# Patient Record
Sex: Male | Born: 1945
Health system: Southern US, Community
[De-identification: ages and names within clinical notes are randomized; demographics above are authoritative.]

## PROBLEM LIST (undated history)

## (undated) DIAGNOSIS — Z9889 Other specified postprocedural states: Secondary | ICD-10-CM

## (undated) DIAGNOSIS — I447 Left bundle-branch block, unspecified: Principal | ICD-10-CM

## (undated) DIAGNOSIS — C61 Malignant neoplasm of prostate: Secondary | ICD-10-CM

## (undated) DIAGNOSIS — R112 Nausea with vomiting, unspecified: Secondary | ICD-10-CM

## (undated) DIAGNOSIS — M1990 Unspecified osteoarthritis, unspecified site: Secondary | ICD-10-CM

## (undated) DIAGNOSIS — R7301 Impaired fasting glucose: Secondary | ICD-10-CM

## (undated) DIAGNOSIS — R7303 Prediabetes: Secondary | ICD-10-CM

## (undated) DIAGNOSIS — I10 Essential (primary) hypertension: Secondary | ICD-10-CM

## (undated) DIAGNOSIS — K4090 Unilateral inguinal hernia, without obstruction or gangrene, not specified as recurrent: Secondary | ICD-10-CM

## (undated) DIAGNOSIS — E781 Pure hyperglyceridemia: Secondary | ICD-10-CM

## (undated) DIAGNOSIS — F419 Anxiety disorder, unspecified: Secondary | ICD-10-CM

## (undated) HISTORY — DX: Impaired fasting glucose: R73.01

## (undated) HISTORY — DX: Left bundle-branch block, unspecified: I44.7

## (undated) HISTORY — DX: Essential (primary) hypertension: I10

## (undated) HISTORY — DX: Pure hyperglyceridemia: E78.1

## (undated) HISTORY — DX: Anxiety disorder, unspecified: F41.9

## (undated) HISTORY — PX: COLONOSCOPY: SHX174

---
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
## 1993-09-19 HISTORY — PX: HERNIA REPAIR: SHX51

## 2003-10-25 ENCOUNTER — Encounter: Payer: Self-pay | Admitting: Cardiology

## 2009-12-14 ENCOUNTER — Encounter: Payer: Self-pay | Admitting: Cardiology

## 2009-12-21 ENCOUNTER — Encounter: Payer: Self-pay | Admitting: Cardiology

## 2010-06-22 ENCOUNTER — Encounter: Payer: Self-pay | Admitting: Cardiology

## 2011-12-05 ENCOUNTER — Telehealth: Payer: Self-pay | Admitting: Cardiology

## 2011-12-05 NOTE — Telephone Encounter (Signed)
Per pt spouse he has had some issues with his b/p and was a pt of Dr. Swaziland but doesn't know when the last visit was and wants to be seen

## 2011-12-05 NOTE — Telephone Encounter (Signed)
Unable to find paper chart on patient or information.   Spoke with wife and he currently sees Dr Felipa Eth for blood pressure issues.  Is scheduled to see Dr Felipa Eth for CPE in April.  Feels he needs to be seen since blood pressure under poor control.  ill forward back to Lela to call patient and get an appointment and to [facility], LPN with Dr Thomasene Lot  Call back on cell # [phone]

## 2011-12-06 NOTE — Telephone Encounter (Signed)
Patient's wife called, appointment scheduled with Dr.Jordan 01/10/12.Stated patient has appointment with Dr.Avva 01/09/12,stated she will bring copy of B/P readings and copy of last lab.

## 2011-12-20 ENCOUNTER — Encounter: Payer: Self-pay | Admitting: Cardiology

## 2012-01-04 ENCOUNTER — Encounter: Payer: Self-pay | Admitting: Cardiology

## 2012-01-10 ENCOUNTER — Ambulatory Visit: Payer: Self-pay | Admitting: Cardiology

## 2012-01-10 ENCOUNTER — Ambulatory Visit (INDEPENDENT_AMBULATORY_CARE_PROVIDER_SITE_OTHER): Payer: Self-pay | Admitting: Cardiology

## 2012-01-10 ENCOUNTER — Encounter: Payer: Self-pay | Admitting: Cardiology

## 2012-01-10 VITALS — BP 164/88 | HR 90 | Ht 73.0 in | Wt 201.0 lb

## 2012-01-10 DIAGNOSIS — R7309 Other abnormal glucose: Secondary | ICD-10-CM

## 2012-01-10 DIAGNOSIS — I10 Essential (primary) hypertension: Secondary | ICD-10-CM

## 2012-01-10 DIAGNOSIS — R7303 Prediabetes: Secondary | ICD-10-CM

## 2012-01-10 DIAGNOSIS — E785 Hyperlipidemia, unspecified: Secondary | ICD-10-CM | POA: Insufficient documentation

## 2012-01-10 MED ORDER — CARVEDILOL 12.5 MG PO TABS: 12.5000 mg | ORAL_TABLET | Freq: Two times a day (BID) | ORAL | Status: DC

## 2012-01-10 NOTE — Patient Instructions (Signed)
Continue with your diet and exercise.  We will add carvedilol 12.5 mg twice a day to your current medication.  I will see you again in 6 weeks.

## 2012-01-10 NOTE — Progress Notes (Signed)
Wilmon Arms Date of Birth: 03-06-1946 Medical Record #[id number]  History of Present Illness: Mr. Demetriou is self-referred today for evaluation of hypertension. He is a pleasant 66 year old white male who I know well from the past. He has a history of hypertension and prediabetes. He also has a history of hyperlipidemia. He notes that his blood pressure readings have been quite labile and have been poorly controlled. This was attributed to anxiety in the past. However, even when he checks his blood pressure readings at home he gets readings that are elevated. Bringing a diary of his readings today his blood pressure ranges anywhere from 142- 180 systolic with diastolics between 86 and 100. He has been on lisinopril 40 mg chronically. He tolerates this well. He walks 30 minutes a day on his treadmill and eats a heart healthy diet. He has controlled his weight.  Current Outpatient Prescriptions on File Prior to Visit  Medication Sig Dispense Refill  . Calcium Carbonate-Vitamin D (CALCIUM + D PO) Take 1,200 mg by mouth daily.      Marland Kitchen lisinopril (PRINIVIL,ZESTRIL) 40 MG tablet Take 40 mg by mouth daily.      . simvastatin (ZOCOR) 40 MG tablet Take 40 mg by mouth every evening.      . carvedilol (COREG) 12.5 MG tablet Take 1 tablet (12.5 mg total) by mouth 2 (two) times daily with a meal.  60 tablet  11    Allergies  Allergen Reactions  . Codeine     Past Medical History  Diagnosis Date  . Hypertriglyceridemia     Mild  . Hemorrhoids   . White coat hypertension   . Impaired fasting glucose   . Allergic rhinitis   . Anxiety     Past Surgical History  Procedure Date  . Tonsillectomy 1947  . Hernia repair 1995  . Colonoscopy     History  Smoking status  . Former Smoker  Smokeless tobacco  . Not on file    History  Alcohol Use No    Family History  Problem Relation Age of Onset  . Heart attack Mother   . Skin cancer Mother   . Diabetes Mother   . Hypertension Mother      Review of Systems: The review of systems is positive for anxiety.  All other systems were reviewed and are negative.  Physical Exam: BP 164/88  Pulse 90  Ht 6\' 1"  (1.854 m)  Wt 91.173 kg (201 lb)  BMI 26.52 kg/m2 The patient is alert and oriented x 3.  The mood and affect are normal.  The skin is warm and dry.  Color is normal.  The HEENT exam reveals that the sclera are nonicteric.  The mucous membranes are moist.  The carotids are 2+ without bruits.  There is no thyromegaly.  There is no JVD.  The lungs are clear.  The chest wall is non tender.  The heart exam reveals a regular rate with a normal S1 and S2.  There are no murmurs, gallops, or rubs.  The PMI is not displaced.   Abdominal exam reveals good bowel sounds.  There is no guarding or rebound.  There is no hepatosplenomegaly or tenderness.  There are no masses.  Exam of the legs reveal no clubbing, cyanosis, or edema.  The legs are without rashes.  The distal pulses are intact.  Cranial nerves II - XII are intact.  Motor and sensory functions are intact.  The gait is normal.  LABORATORY DATA: ECG  demonstrates normal sinus rhythm. He has LVH with repolarization abnormality. Laboratory data was reviewed from one year ago which demonstrated good control of his lipids. He had normal renal function and liver function.  Assessment / Plan:

## 2012-01-10 NOTE — Assessment & Plan Note (Signed)
He appears to be doing right things from a lifestyle modification standpoint. I think he needs additional blood pressure lowering medication and started him on carvedilol 12.5 mg twice a day. We'll continue with his lisinopril. I reinforced the need for sodium restriction. He will keep a diary of his blood pressure and return in 6 weeks for followup. If we have difficulty controlling his blood pressure on multiple agents we may need to consider evaluation for secondary causes of hypertension.

## 2012-02-21 ENCOUNTER — Encounter: Payer: Self-pay | Admitting: Cardiology

## 2012-02-21 ENCOUNTER — Ambulatory Visit (INDEPENDENT_AMBULATORY_CARE_PROVIDER_SITE_OTHER): Payer: Medicare Other | Admitting: Cardiology

## 2012-02-21 VITALS — BP 172/88 | HR 88 | Ht 73.0 in | Wt 205.8 lb

## 2012-02-21 DIAGNOSIS — I10 Essential (primary) hypertension: Secondary | ICD-10-CM

## 2012-02-21 MED ORDER — CARVEDILOL 25 MG PO TABS: 25.0000 mg | ORAL_TABLET | Freq: Two times a day (BID) | ORAL | Status: DC

## 2012-02-21 NOTE — Patient Instructions (Signed)
We will increase your carvedilol to 25 mg twice a day.  We will schedule you for a renal doppler.  I will see you back in 1 months. Keep your blood pressure diary.

## 2012-02-22 NOTE — Progress Notes (Signed)
Evan Mitchell Date of Birth: Sep 28, 1945 Medical Record #[id number]  History of Present Illness: Evan Mitchell is seen today for followup of his hypertension. On his last visit we had added carvedilol 12.5 mg twice a day. He is tolerating this well. His blood pressure still remained high with typical readings of 155/85. He did bring his cuff in today and we correlated. His cuff tends to read higher on the diastolic but was very close on systolic readings. In general he continues to feel well. He does report that since he has been on carvedilol he has had no further anxiety spells.  Current Outpatient Prescriptions on File Prior to Visit  Medication Sig Dispense Refill  . aspirin EC 81 MG tablet Take 81 mg by mouth daily.      . Calcium Carbonate-Vitamin D (CALCIUM + D PO) Take 1,200 mg by mouth daily.      . fish oil-omega-3 fatty acids 1000 MG capsule Take 2 g by mouth 3 (three) times daily.      . Flaxseed, Linseed, (FLAX SEED OIL) 1000 MG CAPS Take 1,000 mg by mouth 3 (three) times daily.      Marland Kitchen GLUCOSAMINE-CHONDROITIN PO Take by mouth 3 (three) times daily.      Marland Kitchen lisinopril (PRINIVIL,ZESTRIL) 40 MG tablet Take 40 mg by mouth daily.      . Multiple Vitamins-Minerals (PRESERVISION AREDS PO) Take by mouth 2 (two) times daily.      . simvastatin (ZOCOR) 40 MG tablet Take 40 mg by mouth every evening.      . vitamin C (ASCORBIC ACID) 500 MG tablet Take 500 mg by mouth daily.        Allergies  Allergen Reactions  . Codeine     Past Medical History  Diagnosis Date  . Hypertriglyceridemia     Mild  . Hemorrhoids   . White coat hypertension   . Impaired fasting glucose   . Allergic rhinitis   . Anxiety     Past Surgical History  Procedure Date  . Tonsillectomy 1947  . Hernia repair 1995  . Colonoscopy     History  Smoking status  . Former Smoker  Smokeless tobacco  . Not on file    History  Alcohol Use No    Family History  Problem Relation Age of Onset  . Heart attack  Mother   . Skin cancer Mother   . Diabetes Mother   . Hypertension Mother     Review of Systems: As noted in history of present illness.  All other systems were reviewed and are negative.  Physical Exam: BP 172/88  Pulse 88  Ht 6\' 1"  (1.854 m)  Wt 205 lb 12.8 oz (93.35 kg)  BMI 27.15 kg/m2 The patient is alert and oriented x 3.  The mood and affect are normal.  The skin is warm and dry.  Color is normal.  The HEENT exam reveals that the sclera are nonicteric.  The mucous membranes are moist.  The carotids are 2+ without bruits.  There is no thyromegaly.  There is no JVD.  The lungs are clear.  The chest wall is non tender.  The heart exam reveals a regular rate with a normal S1 and S2.  There are no murmurs, gallops, or rubs.  The PMI is not displaced.   Abdominal exam reveals good bowel sounds.  There is no guarding or rebound.  There is no hepatosplenomegaly or tenderness.  There are no masses.  Exam of the  legs reveal no clubbing, cyanosis, or edema.  The legs are without rashes.  The distal pulses are intact.  Cranial nerves II - XII are intact.  Motor and sensory functions are intact.  The gait is normal.  LABORATORY DATA:   Assessment / Plan:

## 2012-02-22 NOTE — Assessment & Plan Note (Signed)
Blood pressure still not adequately controlled on combination of carvedilol and lisinopril. We will increase his carvedilol 25 mg twice a day. We'll schedule him for renal duplex. Continue lifestyle modifications. We'll followup again in 4-6 weeks.

## 2012-03-14 ENCOUNTER — Encounter (INDEPENDENT_AMBULATORY_CARE_PROVIDER_SITE_OTHER): Payer: Medicare Other

## 2012-03-14 DIAGNOSIS — I10 Essential (primary) hypertension: Secondary | ICD-10-CM

## 2012-03-28 ENCOUNTER — Encounter: Payer: Self-pay | Admitting: Cardiology

## 2012-03-28 ENCOUNTER — Ambulatory Visit (INDEPENDENT_AMBULATORY_CARE_PROVIDER_SITE_OTHER): Payer: Medicare Other | Admitting: Cardiology

## 2012-03-28 VITALS — BP 158/90 | HR 73 | Ht 73.0 in | Wt 207.0 lb

## 2012-03-28 DIAGNOSIS — I10 Essential (primary) hypertension: Secondary | ICD-10-CM

## 2012-03-28 MED ORDER — HYDROCHLOROTHIAZIDE 25 MG PO TABS: 25.0000 mg | ORAL_TABLET | Freq: Every day | ORAL | Status: DC

## 2012-03-28 NOTE — Patient Instructions (Signed)
We will add HCTZ to your current medication  I will see you in 2 months and check your potassium then

## 2012-03-28 NOTE — Assessment & Plan Note (Signed)
Blood pressure still not optimal despite lisinopril 40 mg daily and carvedilol 25 mg twice a day. We will add HCTZ 25 mg daily. I will have him return in 2 months and we will check a basic metabolic panel at that time. He is to call if he has any significant side effects.

## 2012-03-28 NOTE — Progress Notes (Signed)
Evan Mitchell Date of Birth: 07/29/1946 Medical Record #[id number]  History of Present Illness: Evan Mitchell is seen today for followup of his hypertension. On his last visit we increased his carvedilol to 25 mg twice a day. He is tolerating this well. His home blood pressure readings have ranged anywhere from 138 2 160 systolic. Diastolic readings are typically in the mid 80s. He still reports significant anxiety when his blood pressure is taken. He has no other new complaints.  Current Outpatient Prescriptions on File Prior to Visit  Medication Sig Dispense Refill  . aspirin EC 81 MG tablet Take 81 mg by mouth daily.      . Calcium Carbonate-Vitamin D (CALCIUM + D PO) Take 1,200 mg by mouth daily.      . carvedilol (COREG) 25 MG tablet Take 1 tablet (25 mg total) by mouth 2 (two) times daily.  180 tablet  3  . fish oil-omega-3 fatty acids 1000 MG capsule Take 2 g by mouth 3 (three) times daily.      . Flaxseed, Linseed, (FLAX SEED OIL) 1000 MG CAPS Take 1,000 mg by mouth 3 (three) times daily.      Marland Kitchen GLUCOSAMINE-CHONDROITIN PO Take by mouth 3 (three) times daily.      Marland Kitchen lisinopril (PRINIVIL,ZESTRIL) 40 MG tablet Take 40 mg by mouth daily.      . Multiple Vitamins-Minerals (PRESERVISION AREDS PO) Take by mouth 2 (two) times daily.      . simvastatin (ZOCOR) 40 MG tablet Take 40 mg by mouth every evening.      . vitamin C (ASCORBIC ACID) 500 MG tablet Take 500 mg by mouth daily.      . hydrochlorothiazide (HYDRODIURIL) 25 MG tablet Take 1 tablet (25 mg total) by mouth daily.  90 tablet  3    Allergies  Allergen Reactions  . Codeine     Past Medical History  Diagnosis Date  . Hypertriglyceridemia     Mild  . Hemorrhoids   . White coat hypertension   . Impaired fasting glucose   . Allergic rhinitis   . Anxiety     Past Surgical History  Procedure Date  . Tonsillectomy 1947  . Hernia repair 1995  . Colonoscopy     History  Smoking status  . Former Smoker  Smokeless tobacco    . Not on file    History  Alcohol Use No    Family History  Problem Relation Age of Onset  . Heart attack Mother   . Skin cancer Mother   . Diabetes Mother   . Hypertension Mother     Review of Systems: As noted in history of present illness.  All other systems were reviewed and are negative.  Physical Exam: BP 158/90  Pulse 73  Ht 6\' 1"  (1.854 m)  Wt 207 lb (93.895 kg)  BMI 27.31 kg/m2  SpO2 98% The patient is alert and oriented x 3.  The mood and affect are normal.  The skin is warm and dry.  Color is normal.  The HEENT is unremarkable. The carotids are 2+ without bruits.  There is no thyromegaly.  There is no JVD.  The lungs are clear.  The chest wall is non tender.  The heart exam reveals a regular rate with a normal S1 and S2.  There are no murmurs, gallops, or rubs.  The PMI is not displaced.   Abdominal exam reveals good bowel sounds.  There is no guarding or rebound.  There is  no hepatosplenomegaly or tenderness.  There are no masses.  Exam of the legs reveal no clubbing, cyanosis, or edema.  The legs are without rashes.  The distal pulses are intact.  Cranial nerves II - XII are intact.  Motor and sensory functions are intact.  The gait is normal.  LABORATORY DATA:   Assessment / Plan:

## 2012-05-30 ENCOUNTER — Encounter: Payer: Self-pay | Admitting: Cardiology

## 2012-05-30 ENCOUNTER — Other Ambulatory Visit (INDEPENDENT_AMBULATORY_CARE_PROVIDER_SITE_OTHER): Payer: Medicare Other

## 2012-05-30 ENCOUNTER — Ambulatory Visit (INDEPENDENT_AMBULATORY_CARE_PROVIDER_SITE_OTHER): Payer: Medicare Other | Admitting: Cardiology

## 2012-05-30 VITALS — BP 136/78 | HR 74 | Ht 73.0 in | Wt 208.4 lb

## 2012-05-30 DIAGNOSIS — I10 Essential (primary) hypertension: Secondary | ICD-10-CM

## 2012-05-30 DIAGNOSIS — E785 Hyperlipidemia, unspecified: Secondary | ICD-10-CM

## 2012-05-30 NOTE — Patient Instructions (Signed)
Continue your current therapy  We will call with the results of your lab work today.  I will see you again in 6 months.

## 2012-05-30 NOTE — Progress Notes (Signed)
Evan Mitchell Date of Birth: [birth year] Medical Record #[id number]  History of Present Illness: Evan Mitchell is seen today for followup of Evan Mitchell hypertension. On Evan Mitchell last visit we added HCTZ to Evan Mitchell medications. Since then Evan Mitchell blood pressure has been excellent. Evan Mitchell home blood pressure readings have ranged from 120-140 systolic with diastolic readings from 72-90. He feels very well. He has had less anxiety. He denies any shortness of breath. Evan Mitchell energy level is good.  Current Outpatient Prescriptions on File Prior to Visit  Medication Sig Dispense Refill  . aspirin EC 81 MG tablet Take 81 mg by mouth daily.      . Calcium Carbonate-Vitamin D (CALCIUM + D PO) Take 1,200 mg by mouth daily.      . carvedilol (COREG) 25 MG tablet Take 1 tablet (25 mg total) by mouth 2 (two) times daily.  180 tablet  3  . fish oil-omega-3 fatty acids 1000 MG capsule Take 2 g by mouth 3 (three) times daily.      . Flaxseed, Linseed, (FLAX SEED OIL) 1000 MG CAPS Take 1,000 mg by mouth 3 (three) times daily.      Marland Kitchen GLUCOSAMINE-CHONDROITIN PO Take by mouth 3 (three) times daily.      . hydrochlorothiazide (HYDRODIURIL) 25 MG tablet Take 1 tablet (25 mg total) by mouth daily.  90 tablet  3  . lisinopril (PRINIVIL,ZESTRIL) 40 MG tablet Take 40 mg by mouth daily.      . Multiple Vitamins-Minerals (PRESERVISION AREDS PO) Take by mouth 2 (two) times daily.      . simvastatin (ZOCOR) 40 MG tablet Take 40 mg by mouth every evening.      . vitamin C (ASCORBIC ACID) 500 MG tablet Take 500 mg by mouth daily.        Allergies  Allergen Reactions  . Codeine     Past Medical History  Diagnosis Date  . Hypertriglyceridemia     Mild  . Hemorrhoids   . White coat hypertension   . Impaired fasting glucose   . Allergic rhinitis   . Anxiety     Past Surgical History  Procedure Date  . Tonsillectomy 1947  . Hernia repair 1995  . Colonoscopy     History  Smoking status  . Former Smoker  Smokeless tobacco  . Not on file     History  Alcohol Use No    Family History  Problem Relation Age of Onset  . Heart attack Mother   . Skin cancer Mother   . Diabetes Mother   . Hypertension Mother     Review of Systems: As noted in history of present illness.  All other systems were reviewed and are negative.  Physical Exam: BP 136/78  Pulse 74  Ht 6\' 1"  (1.854 m)  Wt 94.53 kg (208 lb 6.4 oz)  BMI 27.50 kg/m2  SpO2 95% The patient is alert and oriented x 3.  The mood and affect are normal.  The skin is warm and dry.  Color is normal.  The HEENT is unremarkable. The carotids are 2+ without bruits.  There is no thyromegaly.  There is no JVD.  The lungs are clear.  The chest wall is non tender.  The heart exam reveals a regular rate with a normal S1 and S2.  There are no murmurs, gallops, or rubs.  The PMI is not displaced.   Abdominal exam reveals good bowel sounds.  There is no guarding or rebound.  There is no hepatosplenomegaly or  tenderness.  There are no masses.  Exam of the legs reveal no clubbing, cyanosis, or edema.  The legs are without rashes.  The distal pulses are intact.  Cranial nerves II - XII are intact.  Motor and sensory functions are intact.  The gait is normal.  LABORATORY DATA:   Assessment / Plan: 1. Hypertension. Blood pressure is now well controlled on combination of carvedilol, lisinopril, and HCTZ. We will check a basic metabolic panel today. We'll continue on Evan Mitchell current therapy and followup again in 6 months.

## 2012-05-31 LAB — BASIC METABOLIC PANEL
Chloride: 103 mEq/L (ref 96–112)
Creatinine, Ser: 1 mg/dL (ref 0.4–1.5)
GFR: 80.77 mL/min (ref >60.00)

## 2012-12-10 ENCOUNTER — Telehealth: Payer: Self-pay | Admitting: Cardiology

## 2012-12-10 NOTE — Telephone Encounter (Signed)
New Prob   Pt currently has an upper resp infection and an infection in both eyes. Would like to know if Dr. Swaziland still wants him to come in on Wednesday or reschedule.

## 2012-12-10 NOTE — Telephone Encounter (Signed)
Pt's wife called because she said pt started having a respiratory infection and infection in both eyes Pt was seen by his PCP last Wednesday and diagnosed him having a Upper respiratory  virus infection also the same in his eyes . The PCP recommended antibiotics drops  for his eye. Pt has not have a temperature for the last 3 days. Pt's eye look a lot better. Pt returned to work today. Wife wants to know if it is okay with Dr. Swaziland to come for  his office visit on Wednesday 3./26/14.

## 2012-12-11 NOTE — Telephone Encounter (Signed)
Spoke with patient's wife she stated husband has not had a fever in 4 days and is feeling much better.States he will be keeping appointment 12/12/12 with Dr.Jordan.

## 2012-12-12 ENCOUNTER — Encounter: Payer: Self-pay | Admitting: Cardiology

## 2012-12-12 ENCOUNTER — Ambulatory Visit (INDEPENDENT_AMBULATORY_CARE_PROVIDER_SITE_OTHER): Payer: Medicare Other | Admitting: Cardiology

## 2012-12-12 VITALS — BP 148/70 | HR 80 | Ht 73.0 in | Wt 208.8 lb

## 2012-12-12 DIAGNOSIS — E785 Hyperlipidemia, unspecified: Secondary | ICD-10-CM

## 2012-12-12 DIAGNOSIS — I447 Left bundle-branch block, unspecified: Secondary | ICD-10-CM | POA: Insufficient documentation

## 2012-12-12 DIAGNOSIS — I10 Essential (primary) hypertension: Secondary | ICD-10-CM

## 2012-12-12 HISTORY — DX: Left bundle-branch block, unspecified: I44.7

## 2012-12-12 NOTE — Patient Instructions (Signed)
We will schedule you for a nuclear stress test and an echocardiogram  Continue your current medication

## 2012-12-12 NOTE — Progress Notes (Signed)
Evan Mitchell Date of Birth: 08-21-1946 Medical Record #[id number]  History of Present Illness: Evan Mitchell is seen today for followup of his hypertension. He reports he is doing very well. He brings extensive blood pressure readings which show excellent control with only one reading above 150 systolic. He is feeling well. He denies any symptoms of dyspnea or chest pain. He's had no edema. His anxiety is still well controlled.  Current Outpatient Prescriptions on File Prior to Visit  Medication Sig Dispense Refill  . aspirin EC 81 MG tablet Take 81 mg by mouth daily.      . Calcium Carbonate-Vitamin D (CALCIUM + D PO) Take 1,200 mg by mouth daily.      . carvedilol (COREG) 25 MG tablet Take 1 tablet (25 mg total) by mouth 2 (two) times daily.  180 tablet  3  . fish oil-omega-3 fatty acids 1000 MG capsule Take 2 g by mouth 3 (three) times daily.      . Flaxseed, Linseed, (FLAX SEED OIL) 1000 MG CAPS Take 1,000 mg by mouth 3 (three) times daily.      Marland Kitchen GLUCOSAMINE-CHONDROITIN PO Take by mouth 3 (three) times daily.      . hydrochlorothiazide (HYDRODIURIL) 25 MG tablet Take 1 tablet (25 mg total) by mouth daily.  90 tablet  3  . lisinopril (PRINIVIL,ZESTRIL) 40 MG tablet Take 40 mg by mouth daily.      . Multiple Vitamins-Minerals (PRESERVISION AREDS PO) Take by mouth 2 (two) times daily.      . simvastatin (ZOCOR) 40 MG tablet Take 40 mg by mouth every evening.      . vitamin C (ASCORBIC ACID) 500 MG tablet Take 500 mg by mouth daily.       No current facility-administered medications on file prior to visit.    Allergies  Allergen Reactions  . Codeine     Past Medical History  Diagnosis Date  . Hypertriglyceridemia     Mild  . Hemorrhoids   . HTN (hypertension)   . Impaired fasting glucose   . Allergic rhinitis   . Anxiety   . LBBB (left bundle branch block) 12/12/2012    Past Surgical History  Procedure Laterality Date  . Tonsillectomy  1947  . Hernia repair  1995  .  Colonoscopy      History  Smoking status  . Former Smoker  Smokeless tobacco  . Not on file    History  Alcohol Use No    Family History  Problem Relation Age of Onset  . Heart attack Mother   . Skin cancer Mother   . Diabetes Mother   . Hypertension Mother     Review of Systems: As noted in history of present illness.  All other systems were reviewed and are negative.  Physical Exam: BP 148/70  Pulse 80  Ht 6\' 1"  (1.854 m)  Wt 208 lb 12.8 oz (94.711 kg)  BMI 27.55 kg/m2 The patient is alert and oriented x 3.   The skin is warm and dry.  Color is normal.  The HEENT is unremarkable. The carotids are 2+ without bruits.  There is no thyromegaly.  There is no JVD.  The lungs are clear.  The heart exam reveals a regular rate with a normal S1 and S2.  There are no murmurs, gallops, or rubs.  The PMI is not displaced.   Abdominal exam reveals good bowel sounds.   There are no masses.  Exam of the legs reveal no  clubbing, cyanosis, or edema.  The distal pulses are intact.  Cranial nerves II - XII are intact.  Motor and sensory functions are intact.  The gait is normal.  LABORATORY DATA: ECG today demonstrates normal sinus rhythm with a left bundle branch block. This is new compared to April 2013.  Assessment / Plan: 1. Hypertension. Blood pressure is now well controlled on combination of carvedilol, lisinopril, and HCTZ.  We'll continue on his current therapy and followup again in 6 months.  2. Left bundle branch block. This is a new finding. Given his risk factors I recommended a LexiScan Myoview study to rule out ischemia and an echocardiogram to assess his LV function. If these are unremarkable we can reassure him that his left bundle branch block is benign. I suspect it is related to his chronic hypertension.

## 2012-12-14 ENCOUNTER — Ambulatory Visit (HOSPITAL_COMMUNITY): Payer: Medicare Other | Attending: Cardiology | Admitting: Radiology

## 2012-12-14 DIAGNOSIS — I447 Left bundle-branch block, unspecified: Secondary | ICD-10-CM

## 2012-12-14 DIAGNOSIS — Z87891 Personal history of nicotine dependence: Secondary | ICD-10-CM | POA: Insufficient documentation

## 2012-12-14 DIAGNOSIS — Z8249 Family history of ischemic heart disease and other diseases of the circulatory system: Secondary | ICD-10-CM | POA: Insufficient documentation

## 2012-12-14 DIAGNOSIS — E785 Hyperlipidemia, unspecified: Secondary | ICD-10-CM

## 2012-12-14 DIAGNOSIS — I10 Essential (primary) hypertension: Secondary | ICD-10-CM

## 2012-12-14 NOTE — Progress Notes (Signed)
Echocardiogram performed.  

## 2012-12-18 ENCOUNTER — Ambulatory Visit (HOSPITAL_COMMUNITY): Payer: Medicare Other | Attending: Cardiology | Admitting: Radiology

## 2012-12-18 VITALS — BP 152/103 | HR 75 | Ht 73.0 in | Wt 203.0 lb

## 2012-12-18 DIAGNOSIS — E785 Hyperlipidemia, unspecified: Secondary | ICD-10-CM

## 2012-12-18 DIAGNOSIS — R9431 Abnormal electrocardiogram [ECG] [EKG]: Secondary | ICD-10-CM

## 2012-12-18 DIAGNOSIS — Z87891 Personal history of nicotine dependence: Secondary | ICD-10-CM | POA: Insufficient documentation

## 2012-12-18 DIAGNOSIS — I10 Essential (primary) hypertension: Secondary | ICD-10-CM

## 2012-12-18 DIAGNOSIS — I447 Left bundle-branch block, unspecified: Secondary | ICD-10-CM | POA: Insufficient documentation

## 2012-12-18 DIAGNOSIS — Z8249 Family history of ischemic heart disease and other diseases of the circulatory system: Secondary | ICD-10-CM | POA: Insufficient documentation

## 2012-12-18 MED ORDER — ADENOSINE (DIAGNOSTIC) 3 MG/ML IV SOLN
0.5600 mg/kg | Freq: Once | INTRAVENOUS | Status: AC
  Administered 2012-12-18: 51.6 mg via INTRAVENOUS

## 2012-12-18 MED ORDER — TECHNETIUM TC 99M SESTAMIBI GENERIC - CARDIOLITE
10.0000 | Freq: Once | INTRAVENOUS | Status: AC | PRN
  Administered 2012-12-18: 10 via INTRAVENOUS

## 2012-12-18 MED ORDER — TECHNETIUM TC 99M SESTAMIBI GENERIC - CARDIOLITE
30.0000 | Freq: Once | INTRAVENOUS | Status: AC | PRN
  Administered 2012-12-18: 30 via INTRAVENOUS

## 2012-12-18 NOTE — Progress Notes (Signed)
[facility] SITE 3 NUCLEAR MED [address] [place], [place] [address] [phone]    Cardiology Nuclear Med Study  Evan Mitchell is a 67 y.o. male     MRN : [id number]     DOB: 1946-05-31  Procedure Date: 12/18/2012  Nuclear Med Background Indication for Stress Test:  Evaluation for Ischemia and Abnormal EKG (New LBBB) History:  ~18 yrs ago GXT:ok per patient Cardiac Risk Factors: Family History - CAD, History of Smoking, Hypertension, LBBB and Lipids  Symptoms:  No cardiac complaints.   Nuclear Pre-Procedure Caffeine/Decaff Intake:  5:00pm NPO After: 9:00pm   Lungs:  Clear. O2 Sat: 96% on room air. IV 0.9% NS with Angio Cath:  22g  IV Site: R Forearm, tolerated well IV Started by:  Irean Hong, RN  Chest Size (in):  46 Cup Size: n/a  Height: 6\' 1"  (1.854 m)  Weight:  203 lb (92.08 kg)  BMI:  Body mass index is 26.79 kg/(m^2). Tech Comments:  Last dose Coreg @ 6:00 pm yesterday; took Lisinopril, HCTZ this am    Nuclear Med Study 1 or 2 day study: 1 day  Stress Test Type:  Adenosine  Reading MD: Willa Rough, MD  Order Authorizing Provider:  Peter Swazil[place], MD  Resting Radionuclide: Technetium 37m Sestamibi  Resting Radionuclide Dose: 11.0 mCi   Stress Radionuclide:  Technetium 4m Sestamibi  Stress Radionuclide Dose: 33.0 mCi           Stress Protocol Rest HR: 75 Stress HR: 94  Rest BP: 152/103 Stress BP: 164/83  Exercise Time (min): n/a METS: n/a   Predicted Max HR: 152 bpm % Max HR: 61.84 bpm Rate Pressure Product: 72536   Dose of Adenosine (mg):  51.7 Dose of Lexiscan: n/a mg  Dose of Atropine (mg): n/a Dose of Dobutamine: n/a mcg/kg/min (at max HR)  Stress Test Technologist: Smiley Houseman, CMA-N  Nuclear Technologist:  Domenic Polite, CNMT     Rest Procedure:  Myocardial perfusion imaging was performed at rest 45 minutes following the intravenous administration of Technetium 43m Sestamibi.  Rest ECG: Normal sinus rhythm with left bundle  branch block  Stress Procedure:  The patient received IV adenosine at 140 mcg/kg/min for 4 minutes.  He c/o chest pressure with infusion, relieved immediately post infusion.  Technetium 26m Sestamibi was injected at the 2 minute mark and quantitative spect images were obtained after a 45 minute delay.  Stress ECG: No significant change  QPS Raw Data Images:  Patient motion noted; appropriate software correction applied. Stress Images:  Normal uptake in all segments Rest Images:  Normal homogeneous uptake in all areas of the myocardium. Subtraction (SDS):  No evidence of ischemia. Transient Ischemic Dilatation (Normal <1.22):  1.05 Lung/Heart Ratio (Normal <0.45):  0.31  Quantitative Gated Spect Images QGS EDV:  129 ml QGS ESV:  71 ml  Impression Exercise Capacity:  Adenosine study with no exercise. BP Response:  Normal blood pressure response. Clinical Symptoms:  chest burning ECG Impression:  No significant ST segment change suggestive of ischemia. Comparison with Prior Nuclear Study: No previous nuclear study performed  Overall Impression:  This study shows no evidence of scar or ischemia. Wall motion analysis shows some decreased LV function that is probably related to the left bundle branch block. This is a low risk scan.  LV Ejection Fraction: 45%.  LV Wall Motion:   There is some hypokinesis of the septum. This is probably related to left bundle branch block. There is also  some decreased motion at the apex.  Willa Rough, MD

## 2013-02-10 ENCOUNTER — Other Ambulatory Visit: Payer: Self-pay | Admitting: Cardiology

## 2013-02-12 NOTE — Telephone Encounter (Signed)
carvedilol (COREG) 25 MG tablet  Take 1 tablet (25 mg total) by mouth 2 (two) times daily.   180 tablet   3  Assessment / Plan: 1. Hypertension. Blood pressure is now well controlled on combination of carvedilol, lisinopril, and HCTZ.  We'll continue on his current therapy and followup again in 6 months. Encounter-Level Documents:  Electronic signature on 12/12/2012 2:57 PM

## 2013-03-10 ENCOUNTER — Other Ambulatory Visit: Payer: Self-pay | Admitting: Cardiology

## 2013-03-11 NOTE — Telephone Encounter (Signed)
1. Hypertension. Blood pressure is now well controlled on combination of carvedilol, lisinopril, and HCTZ. We'll continue on his current therapy and followup again in 6 months.  Encounter-Level Documents:  Electronic signature on 12/12/2012 2:57 PM

## 2013-03-19 ENCOUNTER — Telehealth: Payer: Self-pay

## 2013-03-19 NOTE — Telephone Encounter (Signed)
Received a request from [facility] requesting patient's last office note,ekg,echo,myoview.Records faxed to fax # [phone].

## 2013-04-24 ENCOUNTER — Other Ambulatory Visit: Payer: Self-pay

## 2013-06-13 ENCOUNTER — Ambulatory Visit (INDEPENDENT_AMBULATORY_CARE_PROVIDER_SITE_OTHER): Payer: Medicare Other | Admitting: Cardiology

## 2013-06-13 ENCOUNTER — Encounter: Payer: Self-pay | Admitting: Cardiology

## 2013-06-13 VITALS — BP 138/78 | HR 80 | Ht 73.0 in | Wt 209.0 lb

## 2013-06-13 DIAGNOSIS — I10 Essential (primary) hypertension: Secondary | ICD-10-CM

## 2013-06-13 DIAGNOSIS — I447 Left bundle-branch block, unspecified: Secondary | ICD-10-CM

## 2013-06-13 DIAGNOSIS — E785 Hyperlipidemia, unspecified: Secondary | ICD-10-CM

## 2013-06-13 NOTE — Progress Notes (Signed)
Evan Mitchell Date of Birth: [birth year] Medical Record #[id number]  History of Present Illness: Evan Mitchell is seen today for followup of his hypertension. He reports he is doing very well. He brings extensive blood pressure readings which show excellent control at home. He is feeling well. He denies any symptoms of dyspnea or chest pain. He's had no edema. His anxiety is still well controlled. On previous evaluation he was found to have a new left bundle branch block. He had subsequent echocardiogram and nuclear stress test as noted below.  Current Outpatient Prescriptions on File Prior to Visit  Medication Sig Dispense Refill  . aspirin EC 81 MG tablet Take 81 mg by mouth daily.      . Calcium Carbonate-Vitamin D (CALCIUM + D PO) Take 1,200 mg by mouth daily.      . carvedilol (COREG) 25 MG tablet TAKE 1 TABLET (25 MG TOTAL) BY MOUTH 2 (TWO) TIMES DAILY.  180 tablet  2  . fish oil-omega-3 fatty acids 1000 MG capsule Take 2 g by mouth 3 (three) times daily.      . Flaxseed, Linseed, (FLAX SEED OIL) 1000 MG CAPS Take 1,000 mg by mouth 3 (three) times daily.      Marland Kitchen GLUCOSAMINE-CHONDROITIN PO Take by mouth 3 (three) times daily.      . hydrochlorothiazide (HYDRODIURIL) 25 MG tablet TAKE 1 TABLET (25 MG TOTAL) BY MOUTH DAILY.  90 tablet  2  . lisinopril (PRINIVIL,ZESTRIL) 40 MG tablet Take 40 mg by mouth daily.      . Multiple Vitamins-Minerals (PRESERVISION AREDS PO) Take by mouth 2 (two) times daily.      . simvastatin (ZOCOR) 40 MG tablet Take 40 mg by mouth every evening.      . vitamin C (ASCORBIC ACID) 500 MG tablet Take 500 mg by mouth daily.       No current facility-administered medications on file prior to visit.    Allergies  Allergen Reactions  . Codeine     Past Medical History  Diagnosis Date  . Hypertriglyceridemia     Mild  . Hemorrhoids   . HTN (hypertension)   . Impaired fasting glucose   . Allergic rhinitis   . Anxiety   . LBBB (left bundle branch block) 12/12/2012     Past Surgical History  Procedure Laterality Date  . Tonsillectomy  1947  . Hernia repair  1995  . Colonoscopy      History  Smoking status  . Former Smoker  Smokeless tobacco  . Not on file    History  Alcohol Use No    Family History  Problem Relation Age of Onset  . Heart attack Mother   . Skin cancer Mother   . Diabetes Mother   . Hypertension Mother     Review of Systems: As noted in history of present illness.  All other systems were reviewed and are negative.  Physical Exam: BP 138/78  Pulse 80  Ht 6\' 1"  (1.854 m)  Wt 94.802 kg (209 lb)  BMI 27.58 kg/m2 The patient is alert and oriented x 3.   The skin is warm and dry.  Color is normal.  The HEENT is unremarkable. The carotids are 2+ without bruits.  There is no thyromegaly.  There is no JVD.  The lungs are clear.  The heart exam reveals a regular rate with a normal S1 and S2.  There are no murmurs, gallops, or rubs.  The PMI is not displaced.   Abdominal  exam reveals good bowel sounds.   There are no masses.  Exam of the legs reveal no clubbing, cyanosis, or edema.  The distal pulses are intact.  Cranial nerves II - XII are intact.  Motor and sensory functions are intact.  The gait is normal.  LABORATORY DATA: Cardiology Nuclear Med Study  Evan Mitchell is a 67 y.o. male MRN : [id number] DOB: [birth year]  Procedure Date: 12/18/2012  Nuclear Med Background  Indication for Stress Test: Evaluation for Ischemia and Abnormal EKG (New LBBB)  History: ~18 yrs ago GXT:ok per patient  Cardiac Risk Factors: Family History - CAD, History of Smoking, Hypertension, LBBB and Lipids  Symptoms: No cardiac complaints.  Nuclear Pre-Procedure  Caffeine/Decaff Intake: 5:00pm  NPO After: 9:00pm   Lungs: Clear.  O2 Sat: 96% on room air.  IV 0.9% NS with Angio Cath: 22g   IV Site: R Forearm, tolerated well  IV Started by: Evan Hong, RN   Chest Size (in): 46  Cup Size: n/a   Height: 6\' 1"  (1.854 m)  Weight: 203 lb (92.08  kg)   BMI: Body mass index is 26.79 kg/(m^2).  Tech Comments: Last dose Coreg @ 6:00 pm yesterday; took Lisinopril, HCTZ this am   Nuclear Med Study  1 or 2 day study: 1 day  Stress Test Type: Adenosine   Reading MD: Willa Rough, MD  Order Authorizing Provider: Candelaria Pies [place], MD   Resting Radionuclide: Technetium 33m Sestamibi  Resting Radionuclide Dose: 11.0 mCi   Stress Radionuclide: Technetium 40m Sestamibi  Stress Radionuclide Dose: 33.0 mCi   Stress Protocol  Rest HR: 75  Stress HR: 94   Rest BP: 152/103  Stress BP: 164/83   Exercise Time (min): n/a  METS: n/a   Predicted Max HR: 152 bpm  % Max HR: 61.84 bpm  Rate Pressure Product: 40981  Dose of Adenosine (mg): 51.7  Dose of Lexiscan: n/a mg   Dose of Atropine (mg): n/a  Dose of Dobutamine: n/a mcg/kg/min (at max HR)   Stress Test Technologist: Smiley Houseman, CMA-N  Nuclear Technologist: Domenic Polite, CNMT   Rest Procedure: Myocardial perfusion imaging was performed at rest 45 minutes following the intravenous administration of Technetium 50m Sestamibi.  Rest ECG: Normal sinus rhythm with left bundle branch block  Stress Procedure: The patient received IV adenosine at 140 mcg/kg/min for 4 minutes. He c/o chest pressure with infusion, relieved immediately post infusion. Technetium 49m Sestamibi was injected at the 2 minute mark and quantitative spect images were obtained after a 45 minute delay.  Stress ECG: No significant change  QPS  Raw Data Images: Patient motion noted; appropriate software correction applied.  Stress Images: Normal uptake in all segments  Rest Images: Normal homogeneous uptake in all areas of the myocardium.  Subtraction (SDS): No evidence of ischemia.  Transient Ischemic Dilatation (Normal <1.22): 1.05  Lung/Heart Ratio (Normal <0.45): 0.31  Quantitative Gated Spect Images  QGS EDV: 129 ml  QGS ESV: 71 ml  Impression  Exercise Capacity: Adenosine study with no exercise.  BP Response: Normal blood  pressure response.  Clinical Symptoms: chest burning  ECG Impression: No significant ST segment change suggestive of ischemia.  Comparison with Prior Nuclear Study: No previous nuclear study performed  Overall Impression: This study shows no evidence of scar or ischemia. Wall motion analysis shows some decreased LV function that is probably related to the left bundle branch block. This is a low risk scan.  LV Ejection Fraction: 45%. LV Wall Motion: There  is some hypokinesis of the septum. This is probably related to left bundle branch block. There is also some decreased motion at the apex.  Willa Rough, MD   Echo:Study Conclusions  - Left ventricle: Wall thickness was increased in a pattern of mild LVH. There was mild focal basal hypertrophy of the septum. Septal-lateral dyssynchrony. Systolic function was normal. The estimated ejection fraction was in the range of 55% to 60%. Wall motion was normal; there were no regional wall motion abnormalities. Doppler parameters are consistent with abnormal left ventricular relaxation (grade 1 diastolic dysfunction). - Mitral valve: Mildly calcified annulus. Trivial regurgitation. - Left atrium: The atrium was mildly dilated. - Right ventricle: The cavity size was normal. Systolic function was normal. - Tricuspid valve: Peak RV-RA gradient: 20mm Hg (S). - Pulmonary arteries: PA peak pressure: 25mm Hg (S). - Inferior vena cava: The vessel was normal in size; the respirophasic diameter changes were in the normal range (= 50%); findings are consistent with normal central venous pressure. Impressions:  - Normal LV size with mild focal basal septal hypertrophy. EF 55-60%. Septal-lateral dyssynchrony suggestive of LBBB. Otherwise, wall motion was normal. Normal RV size and systolic function. No significant valvular abnormalities.     Assessment / Plan: 1. Hypertension. Blood pressure is now well controlled on combination of carvedilol,  lisinopril, and HCTZ.  We'll continue on his current therapy and followup again in 6 months.  2. Left bundle branch block. No ischemia noted on stress testing. Ejection fraction was normal by echo. It was mildly impaired by nuclear study related to LV dyssynchrony. In short, he has an isolated left bundle branch block and his prognosis is good.

## 2013-06-13 NOTE — Patient Instructions (Signed)
Continue your current therapy  I will see you in 6 months.   

## 2013-07-25 ENCOUNTER — Other Ambulatory Visit: Payer: Self-pay

## 2013-11-02 ENCOUNTER — Other Ambulatory Visit: Payer: Self-pay | Admitting: Cardiology

## 2013-11-25 ENCOUNTER — Other Ambulatory Visit: Payer: Self-pay | Admitting: Cardiology

## 2013-12-06 ENCOUNTER — Encounter: Payer: Self-pay | Admitting: Cardiology

## 2013-12-06 ENCOUNTER — Ambulatory Visit (INDEPENDENT_AMBULATORY_CARE_PROVIDER_SITE_OTHER): Payer: Medicare Other | Admitting: Cardiology

## 2013-12-06 VITALS — BP 144/68 | HR 77 | Ht 73.0 in | Wt 209.0 lb

## 2013-12-06 DIAGNOSIS — E785 Hyperlipidemia, unspecified: Secondary | ICD-10-CM

## 2013-12-06 DIAGNOSIS — I447 Left bundle-branch block, unspecified: Secondary | ICD-10-CM

## 2013-12-06 DIAGNOSIS — I10 Essential (primary) hypertension: Secondary | ICD-10-CM

## 2013-12-06 MED ORDER — TADALAFIL 10 MG PO TABS: 10.0000 mg | ORAL_TABLET | Freq: Every day | ORAL | Status: DC | PRN

## 2013-12-06 NOTE — Patient Instructions (Signed)
Continue your current therapy  I will see you in 6 months.   

## 2013-12-07 NOTE — Progress Notes (Signed)
Evan Mitchell Date of Birth: 10-26-1945 Medical Record #[id number]  History of Present Illness: Evan Mitchell is seen today for followup of his hypertension. He reports he is doing very well. He brings extensive blood pressure readings which show excellent control at home. Readings range from 370-488 systolic.  He is feeling well. He denies any symptoms of dyspnea or chest pain. He's had no edema. His anxiety is still well controlled. He does complain of some ED. He was tried on Bystolic instead of Coreg without improvement.   Current Outpatient Prescriptions on File Prior to Visit  Medication Sig Dispense Refill  . allopurinol (ZYLOPRIM) 100 MG tablet Take 100 mg by mouth as needed.       Marland Kitchen aspirin EC 81 MG tablet Take 81 mg by mouth daily.      . Calcium Carbonate-Vitamin D (CALCIUM + D PO) Take 1,200 mg by mouth daily.      . carvedilol (COREG) 25 MG tablet TAKE 1 TABLET TWICE A DAY  180 tablet  0  . Flaxseed, Linseed, (FLAX SEED OIL) 1000 MG CAPS Take 1,000 mg by mouth 3 (three) times daily.      Marland Kitchen GLUCOSAMINE-CHONDROITIN PO Take by mouth 3 (three) times daily.      . hydrochlorothiazide (HYDRODIURIL) 25 MG tablet TAKE 1 TABLET EVERY DAY  90 tablet  0  . lisinopril (PRINIVIL,ZESTRIL) 40 MG tablet Take 40 mg by mouth daily.      . Multiple Vitamins-Minerals (PRESERVISION AREDS PO) Take by mouth 2 (two) times daily.      . simvastatin (ZOCOR) 40 MG tablet Take 40 mg by mouth every evening.      . vitamin C (ASCORBIC ACID) 500 MG tablet Take 500 mg by mouth daily.       No current facility-administered medications on file prior to visit.    Allergies  Allergen Reactions  . Codeine     Past Medical History  Diagnosis Date  . Hypertriglyceridemia     Mild  . Hemorrhoids   . HTN (hypertension)   . Impaired fasting glucose   . Allergic rhinitis   . Anxiety   . LBBB (left bundle branch block) 12/12/2012    Past Surgical History  Procedure Laterality Date  . Tonsillectomy  1947  .  Hernia repair  1995  . Colonoscopy      History  Smoking status  . Former Smoker  Smokeless tobacco  . Not on file    History  Alcohol Use No    Family History  Problem Relation Age of Onset  . Heart attack Mother   . Skin cancer Mother   . Diabetes Mother   . Hypertension Mother     Review of Systems: As noted in history of present illness.  All other systems were reviewed and are negative.  Physical Exam: BP 144/68  Pulse 77  Ht 6\' 1"  (1.854 m)  Wt 209 lb (94.802 kg)  BMI 27.58 kg/m2 The patient is alert and oriented x 3.   The skin is warm and dry.  Color is normal.  The HEENT is unremarkable. The carotids are 2+ without bruits.  There is no thyromegaly.  There is no JVD.  The lungs are clear.  The heart exam reveals a regular rate with a normal S1 and S2.  There are no murmurs, gallops, or rubs.  The PMI is not displaced.   Abdominal exam reveals good bowel sounds.   There are no masses.  Exam of the  legs reveal no clubbing, cyanosis, or edema.  The distal pulses are intact.  Cranial nerves II - XII are intact.  Motor and sensory functions are intact.  The gait is normal.  LABORATORY DATA:  Ecg: NSR, rate 77 bpm, LBBB.  Assessment / Plan: 1. Hypertension. Blood pressure is now well controlled on combination of carvedilol, lisinopril, and HCTZ.  We'll continue on his current therapy and followup again in 6 months.  2. Left bundle branch block. No ischemia noted on stress testing. Ejection fraction was normal by echo.   3. ED- will give a trial of Cialis.

## 2013-12-12 ENCOUNTER — Other Ambulatory Visit: Payer: Self-pay | Admitting: Cardiology

## 2014-01-17 ENCOUNTER — Other Ambulatory Visit: Payer: Self-pay | Admitting: Cardiology

## 2014-02-22 ENCOUNTER — Other Ambulatory Visit: Payer: Self-pay | Admitting: Cardiology

## 2014-05-22 ENCOUNTER — Other Ambulatory Visit: Payer: Self-pay | Admitting: Cardiology

## 2014-06-09 ENCOUNTER — Other Ambulatory Visit: Payer: Self-pay

## 2014-06-09 MED ORDER — CARVEDILOL 25 MG PO TABS: ORAL_TABLET | ORAL | Status: DC

## 2014-06-19 ENCOUNTER — Encounter: Payer: Self-pay | Admitting: Cardiology

## 2014-06-19 ENCOUNTER — Ambulatory Visit (INDEPENDENT_AMBULATORY_CARE_PROVIDER_SITE_OTHER): Payer: Medicare Other | Admitting: Cardiology

## 2014-06-19 VITALS — BP 138/70 | HR 68 | Ht 73.0 in | Wt 207.0 lb

## 2014-06-19 DIAGNOSIS — E785 Hyperlipidemia, unspecified: Secondary | ICD-10-CM

## 2014-06-19 DIAGNOSIS — I447 Left bundle-branch block, unspecified: Secondary | ICD-10-CM

## 2014-06-19 DIAGNOSIS — I10 Essential (primary) hypertension: Secondary | ICD-10-CM

## 2014-06-19 NOTE — Patient Instructions (Signed)
Continue your current therapy  I will see you in 6 months.   

## 2014-06-20 NOTE — Progress Notes (Signed)
Evan Mitchell Date of Birth: [birth year] Medical Record #[id number]  History of Present Illness: Evan Mitchell is seen today for followup of his hypertension. He reports he is doing very well. He brings extensive blood pressure readings which show good control at home. Readings range from 932-355 systolic.  He is feeling well. He denies any symptoms of dyspnea or chest pain. He's had no edema.He does have occasional gout symptoms but this has improved with allopurinol.  Current Outpatient Prescriptions on File Prior to Visit  Medication Sig Dispense Refill  . allopurinol (ZYLOPRIM) 100 MG tablet Take 100 mg by mouth as needed.       Marland Kitchen aspirin EC 81 MG tablet Take 81 mg by mouth daily.      . Calcium Carbonate-Vitamin D (CALCIUM + D PO) Take 1,200 mg by mouth daily.      . carvedilol (COREG) 25 MG tablet TAKE 1 TABLET TWICE A DAY  60 tablet  1  . GLUCOSAMINE-CHONDROITIN PO Take by mouth 3 (three) times daily.      . hydrochlorothiazide (HYDRODIURIL) 25 MG tablet TAKE 1 TABLET EVERY DAY  90 tablet  0  . lisinopril (PRINIVIL,ZESTRIL) 40 MG tablet Take 40 mg by mouth daily.      . Multiple Vitamins-Minerals (PRESERVISION AREDS PO) Take by mouth 2 (two) times daily.      . simvastatin (ZOCOR) 40 MG tablet Take 40 mg by mouth every evening.      . vitamin C (ASCORBIC ACID) 500 MG tablet Take 500 mg by mouth daily.       No current facility-administered medications on file prior to visit.    Allergies  Allergen Reactions  . Codeine     Past Medical History  Diagnosis Date  . Hypertriglyceridemia     Mild  . Hemorrhoids   . HTN (hypertension)   . Impaired fasting glucose   . Allergic rhinitis   . Anxiety   . LBBB (left bundle branch block) 12/12/2012    Past Surgical History  Procedure Laterality Date  . Tonsillectomy  1947  . Hernia repair  1995  . Colonoscopy      History  Smoking status  . Former Smoker  Smokeless tobacco  . Not on file    History  Alcohol Use No     Family History  Problem Relation Age of Onset  . Heart attack Mother   . Skin cancer Mother   . Diabetes Mother   . Hypertension Mother     Review of Systems: As noted in history of present illness.  All other systems were reviewed and are negative.  Physical Exam: BP 138/70  Pulse 68  Ht 6\' 1"  (1.854 m)  Wt 207 lb (93.895 kg)  BMI 27.32 kg/m2 The patient is alert and oriented x 3.    The HEENT is unremarkable. The carotids are 2+ without bruits.  There is no thyromegaly.  There is no JVD.  The lungs are clear.  The heart exam reveals a regular rate with a normal S1 and S2.  There are no murmurs, gallops, or rubs.  The PMI is not displaced.   Abdominal exam reveals good bowel sounds.   There are no masses.  Exam of the legs reveal no clubbing, cyanosis, or edema.  The distal pulses are intact.  Cranial nerves II - XII are intact.  Motor and sensory functions are intact.  The gait is normal.  LABORATORY DATA:   Assessment / Plan: 1. Hypertension. Blood pressure  is now well controlled on combination of carvedilol, lisinopril, and HCTZ.  We'll continue on his current therapy and followup again in 6 months.  2. Left bundle branch block. No ischemia noted on stress testing. Ejection fraction was normal by prior echo.

## 2014-08-08 ENCOUNTER — Other Ambulatory Visit: Payer: Self-pay

## 2014-08-08 MED ORDER — CARVEDILOL 25 MG PO TABS: ORAL_TABLET | ORAL | Status: DC

## 2014-08-22 ENCOUNTER — Other Ambulatory Visit: Payer: Self-pay | Admitting: *Deleted

## 2014-08-22 MED ORDER — HYDROCHLOROTHIAZIDE 25 MG PO TABS: 25.0000 mg | ORAL_TABLET | Freq: Every day | ORAL | Status: DC

## 2014-11-05 ENCOUNTER — Other Ambulatory Visit: Payer: Self-pay

## 2014-11-05 MED ORDER — CARVEDILOL 25 MG PO TABS: ORAL_TABLET | ORAL | Status: DC

## 2015-01-29 ENCOUNTER — Encounter: Payer: Self-pay | Admitting: Cardiology

## 2015-02-09 ENCOUNTER — Other Ambulatory Visit: Payer: Self-pay | Admitting: *Deleted

## 2015-02-09 MED ORDER — HYDROCHLOROTHIAZIDE 25 MG PO TABS: 25.0000 mg | ORAL_TABLET | Freq: Every day | ORAL | Status: DC

## 2015-02-17 ENCOUNTER — Encounter: Payer: Self-pay | Admitting: Cardiology

## 2015-02-17 ENCOUNTER — Ambulatory Visit (INDEPENDENT_AMBULATORY_CARE_PROVIDER_SITE_OTHER): Payer: Medicare Other | Admitting: Cardiology

## 2015-02-17 VITALS — BP 132/80 | HR 81 | Ht 73.0 in | Wt 205.0 lb

## 2015-02-17 DIAGNOSIS — E785 Hyperlipidemia, unspecified: Secondary | ICD-10-CM | POA: Diagnosis not present

## 2015-02-17 DIAGNOSIS — I10 Essential (primary) hypertension: Secondary | ICD-10-CM

## 2015-02-17 DIAGNOSIS — I447 Left bundle-branch block, unspecified: Secondary | ICD-10-CM

## 2015-02-17 MED ORDER — HYDROCHLOROTHIAZIDE 25 MG PO TABS: 25.0000 mg | ORAL_TABLET | Freq: Every day | ORAL | Status: DC

## 2015-02-17 NOTE — Progress Notes (Signed)
[name] Date of Birth: 07-Jul-1946 Medical Record #[id number]  History of Present Illness: Evan Mitchell is seen today for followup of his hypertension. He reports he is doing very well. He brings extensive blood pressure readings which show good control at home. Readings range from 784-696 systolic.  He is feeling well. He denies any symptoms of dyspnea or chest pain. He's had no edema. He is still working and exercises on his treadmill 30 minutes a day.   Current Outpatient Prescriptions on File Prior to Visit  Medication Sig Dispense Refill  . allopurinol (ZYLOPRIM) 100 MG tablet Take 100 mg by mouth as needed.     Marland Kitchen aspirin EC 81 MG tablet Take 81 mg by mouth daily.    . Calcium Carbonate-Vitamin D (CALCIUM + D PO) Take 1,200 mg by mouth daily.    . carvedilol (COREG) 25 MG tablet TAKE 1 TABLET TWICE A DAY 180 tablet 1  . GLUCOSAMINE-CHONDROITIN PO Take by mouth 3 (three) times daily.    Marland Kitchen lisinopril (PRINIVIL,ZESTRIL) 40 MG tablet Take 40 mg by mouth daily.    . Multiple Vitamins-Minerals (PRESERVISION AREDS PO) Take by mouth 2 (two) times daily.    . sildenafil (REVATIO) 20 MG tablet Take 20 mg by mouth as needed.    . simvastatin (ZOCOR) 40 MG tablet Take 40 mg by mouth every evening.    . vitamin C (ASCORBIC ACID) 500 MG tablet Take 500 mg by mouth daily.     No current facility-administered medications on file prior to visit.    Allergies  Allergen Reactions  . Codeine     Past Medical History  Diagnosis Date  . Hypertriglyceridemia     Mild  . Hemorrhoids   . HTN (hypertension)   . Impaired fasting glucose   . Allergic rhinitis   . Anxiety   . LBBB (left bundle branch block) 12/12/2012    Past Surgical History  Procedure Laterality Date  . Tonsillectomy  1947  . Hernia repair  1995  . Colonoscopy      History  Smoking status  . Former Smoker  . Quit date: 09/19/1976  Smokeless tobacco  . Not on file    History  Alcohol Use No    Family History   Problem Relation Age of Onset  . Heart attack Mother   . Skin cancer Mother   . Diabetes Mother   . Hypertension Mother     Review of Systems: As noted in history of present illness.  All other systems were reviewed and are negative.  Physical Exam: BP 132/80 mmHg  Pulse 81  Ht 6\' 1"  (1.854 m)  Wt 92.987 kg (205 lb)  BMI 27.05 kg/m2 The patient is alert and oriented x 3.    The HEENT is unremarkable. The carotids are 2+ without bruits.  There is no thyromegaly.  There is no JVD.  The lungs are clear.  The heart exam reveals a regular rate with a normal S1 and S2.  There are no murmurs, gallops, or rubs.  The PMI is not displaced.   Abdominal exam reveals good bowel sounds.   There are no masses.  Exam of the legs reveal no clubbing, cyanosis, or edema.  The distal pulses are intact.  Cranial nerves II - XII are intact.  Motor and sensory functions are intact.  The gait is normal.  LABORATORY DATA: Reviewed from Dr. Dagmar Hait and scanned into Epic. Good glycemic and cholesterol control. Renal function OK.  Ecg today  shows NSR with LBBB. I have personally reviewed and interpreted this study.     Assessment / Plan: 1. Hypertension. Blood pressure is now well controlled on combination of carvedilol, lisinopril, and HCTZ.  We'll continue on his current therapy and followup again in 6 months.  2. Left bundle branch block. No ischemia noted on prior stress testing. Ejection fraction was normal by prior echo.

## 2015-02-17 NOTE — Patient Instructions (Signed)
Continue your current therapy  I will see you in 6 months.   

## 2015-03-16 ENCOUNTER — Other Ambulatory Visit: Payer: Self-pay

## 2015-05-16 ENCOUNTER — Other Ambulatory Visit: Payer: Self-pay | Admitting: Cardiology

## 2015-05-26 ENCOUNTER — Other Ambulatory Visit: Payer: Self-pay | Admitting: *Deleted

## 2015-05-26 MED ORDER — CARVEDILOL 25 MG PO TABS: ORAL_TABLET | ORAL | Status: DC

## 2015-06-03 ENCOUNTER — Encounter: Payer: Self-pay | Admitting: Cardiology

## 2015-08-26 ENCOUNTER — Telehealth: Payer: Self-pay | Admitting: Cardiology

## 2015-08-26 NOTE — Telephone Encounter (Signed)
Received records from [facility] for appointment on 09/25/15 with Dr Martinique.  Records given to [facility] (medical records) for Dr Doug Sou schedule on 09/25/15. lp

## 2015-09-25 ENCOUNTER — Ambulatory Visit (INDEPENDENT_AMBULATORY_CARE_PROVIDER_SITE_OTHER): Payer: Medicare Other | Admitting: Cardiology

## 2015-09-25 ENCOUNTER — Encounter: Payer: Self-pay | Admitting: Cardiology

## 2015-09-25 VITALS — BP 142/80 | HR 82 | Ht 73.0 in | Wt 205.0 lb

## 2015-09-25 DIAGNOSIS — I447 Left bundle-branch block, unspecified: Secondary | ICD-10-CM

## 2015-09-25 DIAGNOSIS — E785 Hyperlipidemia, unspecified: Secondary | ICD-10-CM | POA: Diagnosis not present

## 2015-09-25 DIAGNOSIS — I10 Essential (primary) hypertension: Secondary | ICD-10-CM | POA: Diagnosis not present

## 2015-09-25 NOTE — Progress Notes (Signed)
Evan Mitchell Date of Birth: [birth year] Medical Record [id number]  History of Present Illness: Evan Mitchell is seen today for followup of his hypertension. He reports he is doing very well. He brings extensive blood pressure readings which show excellent  control at home. Readings range from [id number] systolic.  He is feeling well. He denies any symptoms of dyspnea or chest pain. He's had no edema. He is still working and exercises daily.  Current Outpatient Prescriptions on File Prior to Visit  Medication Sig Dispense Refill  . allopurinol (ZYLOPRIM) 100 MG tablet Take 100 mg by mouth as needed.     Marland Kitchen aspirin EC 81 MG tablet Take 81 mg by mouth daily.    . Calcium Carbonate-Vitamin D (CALCIUM + D PO) Take 1,200 mg by mouth daily.    . carvedilol (COREG) 25 MG tablet TAKE 1 TABLET TWICE A DAY 180 tablet 1  . GLUCOSAMINE-CHONDROITIN PO Take by mouth 3 (three) times daily.    . hydrochlorothiazide (HYDRODIURIL) 25 MG tablet TAKE 1 TABLET (25 MG TOTAL) BY MOUTH DAILY. 90 tablet 2  . lisinopril (PRINIVIL,ZESTRIL) 40 MG tablet Take 40 mg by mouth daily.    . Multiple Vitamins-Minerals (PRESERVISION AREDS PO) Take by mouth 2 (two) times daily.    . sildenafil (REVATIO) 20 MG tablet Take 20 mg by mouth as needed.    . simvastatin (ZOCOR) 40 MG tablet Take 40 mg by mouth every evening.    . vitamin C (ASCORBIC ACID) 500 MG tablet Take 500 mg by mouth daily.     No current facility-administered medications on file prior to visit.    Allergies  Allergen Reactions  . Codeine     Past Medical History  Diagnosis Date  . Hypertriglyceridemia     Mild  . Hemorrhoids   . HTN (hypertension)   . Impaired fasting glucose   . Allergic rhinitis   . Anxiety   . LBBB (left bundle branch block) 12/12/2012    Past Surgical History  Procedure Laterality Date  . Tonsillectomy  1947  . Hernia repair  1995  . Colonoscopy      History  Smoking status  . Former Smoker  . Quit date: 09/19/1976   Smokeless tobacco  . Not on file    History  Alcohol Use No    Family History  Problem Relation Age of Onset  . Heart attack Mother   . Skin cancer Mother   . Diabetes Mother   . Hypertension Mother     Review of Systems: As noted in history of present illness.  All other systems were reviewed and are negative.  Physical Exam: BP 142/80 mmHg  Pulse 82  Ht 6\' 1"  (1.854 m)  Wt 92.987 kg (205 lb)  BMI 27.05 kg/m2  SpO2 96% The patient is alert and oriented x 3.    The HEENT is unremarkable. The carotids are 2+ without bruits.  There is no thyromegaly.  There is no JVD.  The lungs are clear.  The heart exam reveals a regular rate with a normal S1 and S2.  There are no murmurs, gallops, or rubs.  The PMI is not displaced.   Abdominal exam reveals good bowel sounds.   There are no masses.  Exam of the legs reveal no clubbing, cyanosis, or edema.  The distal pulses are intact.  Cranial nerves II - XII are intact.  Motor and sensory functions are intact.  The gait is normal.  LABORATORY DATA: Office records reviewed  from Dr. Dagmar Hait and scanned into Epic.    Assessment / Plan: 1. Hypertension. Blood pressure is now well controlled on combination of carvedilol, lisinopril, and HCTZ.  We'll continue on his current therapy and followup again in 6 months.  2. Left bundle branch block. No ischemia noted on prior stress testing. Ejection fraction was normal by prior echo.

## 2015-09-25 NOTE — Patient Instructions (Signed)
Continue your current therapy  I will see you in 6 months.   

## 2015-11-17 ENCOUNTER — Telehealth: Payer: Self-pay | Admitting: Cardiology

## 2015-11-17 MED ORDER — CARVEDILOL 25 MG PO TABS: 25.0000 mg | ORAL_TABLET | Freq: Two times a day (BID) | ORAL | Status: DC

## 2015-11-17 NOTE — Telephone Encounter (Signed)
Refill sent.

## 2015-11-17 NOTE — Telephone Encounter (Signed)
New message       *STAT* If patient is at the pharmacy, call can be transferred to refill team.   1. Which medications need to be refilled? (please list name of each medication and dose if known) carvedilol 25mg  2. Which pharmacy/location (including street and city if local pharmacy) is medication to be sent to?[email or web address] [email or web address] 3. Do they need a 30 day or 90 day supply? 180 tablets

## 2015-11-19 ENCOUNTER — Other Ambulatory Visit: Payer: Self-pay | Admitting: *Deleted

## 2015-11-19 MED ORDER — CARVEDILOL 25 MG PO TABS: 25.0000 mg | ORAL_TABLET | Freq: Two times a day (BID) | ORAL | Status: DC

## 2015-12-21 ENCOUNTER — Telehealth: Payer: Self-pay | Admitting: Cardiology

## 2015-12-21 NOTE — Telephone Encounter (Signed)
Returned call to patient's wife.She stated she called to schedule husband's July follow up appt with Dr.Jordan.Stated she was told July schedule is not out.Stated this happened before and when she called back no appointments available.Stated husband is doing good.Follow up appointment scheduled with Dr.Jordan 03/18/16 at 2:30 pm.

## 2015-12-21 NOTE — Telephone Encounter (Signed)
[place]  Pt wife requested to speak w/ RN- did not specify nature of phone call. Please call back and discuss.

## 2016-02-24 ENCOUNTER — Other Ambulatory Visit: Payer: Self-pay

## 2016-02-24 MED ORDER — HYDROCHLOROTHIAZIDE 25 MG PO TABS: ORAL_TABLET | ORAL | Status: DC

## 2016-03-18 ENCOUNTER — Ambulatory Visit (INDEPENDENT_AMBULATORY_CARE_PROVIDER_SITE_OTHER): Payer: Medicare Other | Admitting: Cardiology

## 2016-03-18 ENCOUNTER — Encounter: Payer: Self-pay | Admitting: Cardiology

## 2016-03-18 VITALS — BP 122/68 | HR 68 | Ht 72.0 in | Wt 206.0 lb

## 2016-03-18 DIAGNOSIS — E785 Hyperlipidemia, unspecified: Secondary | ICD-10-CM

## 2016-03-18 DIAGNOSIS — I10 Essential (primary) hypertension: Secondary | ICD-10-CM | POA: Diagnosis not present

## 2016-03-18 DIAGNOSIS — I447 Left bundle-branch block, unspecified: Secondary | ICD-10-CM

## 2016-03-18 NOTE — Progress Notes (Signed)
[name] Date of Birth: 25-Mar-1946 Medical Record [id number]  History of Present Illness: Evan Mitchell is seen today for followup of his hypertension. He reports he is doing very well. He brings extensive blood pressure readings which show excellent  control at home. Readings range from [id number] systolic.  He is feeling well. He denies any symptoms of dyspnea or chest pain. He's had no edema. He is still working and exercises daily.  Current Outpatient Prescriptions on File Prior to Visit  Medication Sig Dispense Refill  . allopurinol (ZYLOPRIM) 100 MG tablet Take 100 mg by mouth as needed.     Marland Kitchen aspirin EC 81 MG tablet Take 81 mg by mouth daily.    . Calcium Carbonate-Vitamin D (CALCIUM + D PO) Take 1,200 mg by mouth daily.    . carvedilol (COREG) 25 MG tablet Take 1 tablet (25 mg total) by mouth 2 (two) times daily with a meal. 180 tablet 1  . GLUCOSAMINE-CHONDROITIN PO Take by mouth 3 (three) times daily.    . hydrochlorothiazide (HYDRODIURIL) 25 MG tablet TAKE 1 TABLET (25 MG TOTAL) BY MOUTH DAILY. 90 tablet 2  . lisinopril (PRINIVIL,ZESTRIL) 40 MG tablet Take 40 mg by mouth daily.    . Multiple Vitamins-Minerals (PRESERVISION AREDS PO) Take by mouth 2 (two) times daily.    . sildenafil (REVATIO) 20 MG tablet Take 20 mg by mouth as needed.    . simvastatin (ZOCOR) 40 MG tablet Take 40 mg by mouth every evening.    . vitamin C (ASCORBIC ACID) 500 MG tablet Take 500 mg by mouth daily.     No current facility-administered medications on file prior to visit.    Allergies  Allergen Reactions  . Codeine     Past Medical History  Diagnosis Date  . Hypertriglyceridemia     Mild  . Hemorrhoids   . HTN (hypertension)   . Impaired fasting glucose   . Allergic rhinitis   . Anxiety   . LBBB (left bundle branch block) 12/12/2012    Past Surgical History  Procedure Laterality Date  . Tonsillectomy  1947  . Hernia repair  1995  . Colonoscopy      History  Smoking status  .  Former Smoker  . Quit date: 09/19/1976  Smokeless tobacco  . Never Used    History  Alcohol Use No    Family History  Problem Relation Age of Onset  . Heart attack Mother   . Skin cancer Mother   . Diabetes Mother   . Hypertension Mother     Review of Systems: As noted in history of present illness.  All other systems were reviewed and are negative.  Physical Exam: BP 122/68 mmHg  Pulse 68  Ht 6' (1.829 m)  Wt 206 lb (93.441 kg)  BMI 27.93 kg/m2 The patient is alert and oriented x 3.    The HEENT is unremarkable. The carotids are 2+ without bruits.  There is no thyromegaly.  There is no JVD.  The lungs are clear.  The heart exam reveals a regular rate with a normal S1 and S2.  There are no murmurs, gallops, or rubs.  The PMI is not displaced.   Abdominal exam reveals good bowel sounds.   There are no masses.  Exam of the legs reveal no clubbing, cyanosis, or edema.  The distal pulses are intact.  Cranial nerves II - XII are intact.  Motor and sensory functions are intact.  The gait is normal.  LABORATORY  DATA: Ecg today shows NSR with normal Ecg. Compared to 2016 LBBB resolved. I have personally reviewed and interpreted this study.    Assessment / Plan: 1. Hypertension. Blood pressure is now well controlled on combination of carvedilol, lisinopril, and HCTZ.  We'll continue on his current therapy and followup again in 6-8 months.  2. Left bundle branch block. No ischemia noted on prior stress testing. Ejection fraction was normal by prior echo. Surprisingly LBBB which has been present for 3 years has resolved by Ecg today.

## 2016-03-18 NOTE — Patient Instructions (Signed)
Continue your current therapy  I will see you in 6-8 months. 

## 2016-06-08 ENCOUNTER — Telehealth: Payer: Self-pay | Admitting: Cardiology

## 2016-06-08 NOTE — Telephone Encounter (Signed)
Returned call to patient's wife.She stated she called to schedule appointment for husband in December.She was told he is out of office until January.Follow up appointment scheduled with Dr.Jordan 08/29/16 at 3:15 pm.

## 2016-06-08 NOTE — Telephone Encounter (Signed)
New message      Pts wife would like to speak to nurse Malachy Mood concerning the pt. Please call.

## 2016-08-27 NOTE — Progress Notes (Signed)
Evan Mitchell Date of Birth: Jan 05, 1946 Medical Record [id number]  History of Present Illness: Evan Mitchell is seen today for followup of his hypertension. He reports he is doing very well. Bp has been well controlled at home.  he did have a recent chest and sinus infection. Now he is feeling well. He denies any symptoms of dyspnea or chest pain. He's had no edema. He is still working and exercises daily.  Current Outpatient Prescriptions on File Prior to Visit  Medication Sig Dispense Refill  . allopurinol (ZYLOPRIM) 100 MG tablet Take 100 mg by mouth as needed.     Marland Kitchen aspirin EC 81 MG tablet Take 81 mg by mouth daily.    . Calcium Carbonate-Vitamin D (CALCIUM + D PO) Take 1,200 mg by mouth daily.    . carvedilol (COREG) 25 MG tablet Take 1 tablet (25 mg total) by mouth 2 (two) times daily with a meal. 180 tablet 1  . GLUCOSAMINE-CHONDROITIN PO Take by mouth 3 (three) times daily.    . hydrochlorothiazide (HYDRODIURIL) 25 MG tablet TAKE 1 TABLET (25 MG TOTAL) BY MOUTH DAILY. 90 tablet 2  . lisinopril (PRINIVIL,ZESTRIL) 40 MG tablet Take 40 mg by mouth daily.    . Multiple Vitamins-Minerals (PRESERVISION AREDS PO) Take by mouth 2 (two) times daily.    . sildenafil (REVATIO) 20 MG tablet Take 20 mg by mouth as needed.    . simvastatin (ZOCOR) 40 MG tablet Take 40 mg by mouth every evening.    . vitamin C (ASCORBIC ACID) 500 MG tablet Take 500 mg by mouth daily.     No current facility-administered medications on file prior to visit.     Allergies  Allergen Reactions  . Codeine     Past Medical History:  Diagnosis Date  . Allergic rhinitis   . Anxiety   . Hemorrhoids   . HTN (hypertension)   . Hypertriglyceridemia    Mild  . Impaired fasting glucose   . LBBB (left bundle branch block) 12/12/2012    Past Surgical History:  Procedure Laterality Date  . COLONOSCOPY    . HERNIA REPAIR  1995  . TONSILLECTOMY  1947    History  Smoking Status  . Former Smoker  . Quit date:  09/19/1976  Smokeless Tobacco  . Never Used    History  Alcohol Use No    Family History  Problem Relation Age of Onset  . Heart attack Mother   . Skin cancer Mother   . Diabetes Mother   . Hypertension Mother     Review of Systems: As noted in history of present illness.  All other systems were reviewed and are negative.  Physical Exam: BP 132/70   Pulse 78   Ht 6' (1.829 m)   Wt 203 lb 12.8 oz (92.4 kg)   BMI 27.64 kg/m  The patient is alert and oriented x 3.    The HEENT is unremarkable. The carotids are 2+ without bruits.  There is no thyromegaly.  There is no JVD.  The lungs are clear.  The heart exam reveals a regular rate with a normal S1 and S2.  There are no murmurs, gallops, or rubs.  The PMI is not displaced.   Abdominal exam reveals good bowel sounds.   There are no masses.  Exam of the legs reveal no clubbing, cyanosis, or edema.  The distal pulses are intact.  Cranial nerves II - XII are intact.  Motor and sensory functions are intact.  The  gait is normal.  LABORATORY DATA:  Lab Results  Component Value Date   GLUCOSE 90 05/30/2012   NA 140 05/30/2012   K 4.2 05/30/2012   CL 103 05/30/2012   CREATININE 1.0 05/30/2012   BUN 28 (H) 05/30/2012   CO2 29 05/30/2012   Labs reviewed from primary care: 02/03/16: cholesterol 112, triglycerides 100, HDL 40, LDL 52. Glucose 115. Other chemistries, CBC, TSH normal.   Assessment / Plan: 1. Hypertension. Blood pressure is now well controlled on combination of carvedilol, lisinopril, and HCTZ.  We'll continue on his current therapy and followup again in 6 months.  2. Left bundle branch block- intermittent. No ischemia noted on prior stress testing. Ejection fraction was normal by prior echo.   3. Hypercholesterolemia. On statin with excellent results.

## 2016-08-29 ENCOUNTER — Ambulatory Visit (INDEPENDENT_AMBULATORY_CARE_PROVIDER_SITE_OTHER): Payer: Medicare Other | Admitting: Cardiology

## 2016-08-29 ENCOUNTER — Encounter: Payer: Self-pay | Admitting: Cardiology

## 2016-08-29 VITALS — BP 132/70 | HR 78 | Ht 72.0 in | Wt 203.8 lb

## 2016-08-29 DIAGNOSIS — I10 Essential (primary) hypertension: Secondary | ICD-10-CM | POA: Diagnosis not present

## 2016-08-29 DIAGNOSIS — E7800 Pure hypercholesterolemia, unspecified: Secondary | ICD-10-CM

## 2016-08-29 NOTE — Patient Instructions (Addendum)
Continue your current therapy  I will see you in 6 months.   

## 2016-09-19 DIAGNOSIS — C4490 Unspecified malignant neoplasm of skin, unspecified: Secondary | ICD-10-CM

## 2016-09-19 HISTORY — DX: Unspecified malignant neoplasm of skin, unspecified: C44.90

## 2016-09-20 DIAGNOSIS — Z85828 Personal history of other malignant neoplasm of skin: Secondary | ICD-10-CM | POA: Diagnosis not present

## 2016-09-20 DIAGNOSIS — D2262 Melanocytic nevi of left upper limb, including shoulder: Secondary | ICD-10-CM | POA: Diagnosis not present

## 2016-09-20 DIAGNOSIS — L570 Actinic keratosis: Secondary | ICD-10-CM | POA: Diagnosis not present

## 2016-09-20 DIAGNOSIS — L821 Other seborrheic keratosis: Secondary | ICD-10-CM | POA: Diagnosis not present

## 2016-09-20 DIAGNOSIS — D225 Melanocytic nevi of trunk: Secondary | ICD-10-CM | POA: Diagnosis not present

## 2016-09-20 DIAGNOSIS — D485 Neoplasm of uncertain behavior of skin: Secondary | ICD-10-CM | POA: Diagnosis not present

## 2016-09-20 DIAGNOSIS — D1801 Hemangioma of skin and subcutaneous tissue: Secondary | ICD-10-CM | POA: Diagnosis not present

## 2016-09-20 DIAGNOSIS — L814 Other melanin hyperpigmentation: Secondary | ICD-10-CM | POA: Diagnosis not present

## 2016-11-15 ENCOUNTER — Telehealth: Payer: Self-pay | Admitting: Cardiology

## 2016-11-15 MED ORDER — CARVEDILOL 25 MG PO TABS: 25.0000 mg | ORAL_TABLET | Freq: Two times a day (BID) | ORAL | 1 refills | Status: DC

## 2016-11-15 NOTE — Telephone Encounter (Signed)
Returned call. Wife hoped to speak to [place], but I offered to assist and was able to help her w needs.  Pt appt for June scheduled (6 mo f/u) Rx renewed for carvedilol and sent to new mail order pharmacy [facility]).  Wife voiced thanks for assistance.

## 2016-11-15 NOTE — Telephone Encounter (Signed)
New message      Wife request a callback from the nurse.  She would not tell me what she wanted

## 2017-01-26 ENCOUNTER — Encounter: Payer: Self-pay | Admitting: *Deleted

## 2017-02-09 DIAGNOSIS — M109 Gout, unspecified: Secondary | ICD-10-CM | POA: Diagnosis not present

## 2017-02-09 DIAGNOSIS — Z125 Encounter for screening for malignant neoplasm of prostate: Secondary | ICD-10-CM | POA: Diagnosis not present

## 2017-02-09 DIAGNOSIS — I10 Essential (primary) hypertension: Secondary | ICD-10-CM | POA: Diagnosis not present

## 2017-02-09 DIAGNOSIS — R7301 Impaired fasting glucose: Secondary | ICD-10-CM | POA: Diagnosis not present

## 2017-02-09 DIAGNOSIS — E781 Pure hyperglyceridemia: Secondary | ICD-10-CM | POA: Diagnosis not present

## 2017-02-16 DIAGNOSIS — I454 Nonspecific intraventricular block: Secondary | ICD-10-CM | POA: Diagnosis not present

## 2017-02-16 DIAGNOSIS — F419 Anxiety disorder, unspecified: Secondary | ICD-10-CM | POA: Diagnosis not present

## 2017-02-16 DIAGNOSIS — M1990 Unspecified osteoarthritis, unspecified site: Secondary | ICD-10-CM | POA: Diagnosis not present

## 2017-02-16 DIAGNOSIS — N529 Male erectile dysfunction, unspecified: Secondary | ICD-10-CM | POA: Diagnosis not present

## 2017-02-16 DIAGNOSIS — Z0000 Encounter for general adult medical examination without abnormal findings: Secondary | ICD-10-CM | POA: Diagnosis not present

## 2017-02-16 DIAGNOSIS — E781 Pure hyperglyceridemia: Secondary | ICD-10-CM | POA: Diagnosis not present

## 2017-02-16 DIAGNOSIS — I10 Essential (primary) hypertension: Secondary | ICD-10-CM | POA: Diagnosis not present

## 2017-02-16 DIAGNOSIS — M109 Gout, unspecified: Secondary | ICD-10-CM | POA: Diagnosis not present

## 2017-02-16 DIAGNOSIS — R972 Elevated prostate specific antigen [PSA]: Secondary | ICD-10-CM | POA: Diagnosis not present

## 2017-02-16 DIAGNOSIS — Z1389 Encounter for screening for other disorder: Secondary | ICD-10-CM | POA: Diagnosis not present

## 2017-02-16 DIAGNOSIS — R7301 Impaired fasting glucose: Secondary | ICD-10-CM | POA: Diagnosis not present

## 2017-02-16 DIAGNOSIS — Z6827 Body mass index (BMI) 27.0-27.9, adult: Secondary | ICD-10-CM | POA: Diagnosis not present

## 2017-02-17 DIAGNOSIS — Z1212 Encounter for screening for malignant neoplasm of rectum: Secondary | ICD-10-CM | POA: Diagnosis not present

## 2017-02-18 NOTE — Progress Notes (Signed)
[name] Date of Birth: Sep 28, 1945 Medical Record #[id number]  History of Present Illness: Evan Mitchell is seen today for followup of his hypertension and hyperlipidemia. He reports he is doing very well. BP has been well controlled at home- he brings a diary.   He denies any symptoms of dyspnea or chest pain. He's had no edema. He is still working and exercises daily.  Current Outpatient Prescriptions on File Prior to Visit  Medication Sig Dispense Refill  . allopurinol (ZYLOPRIM) 100 MG tablet Take 300 mg by mouth as needed.     Marland Kitchen aspirin EC 81 MG tablet Take 81 mg by mouth daily.    . Calcium Carbonate-Vitamin D (CALCIUM + D PO) Take 1,200 mg by mouth daily.    . carvedilol (COREG) 25 MG tablet Take 1 tablet (25 mg total) by mouth 2 (two) times daily with a meal. 180 tablet 1  . GLUCOSAMINE-CHONDROITIN PO Take by mouth 3 (three) times daily.    . hydrochlorothiazide (HYDRODIURIL) 25 MG tablet TAKE 1 TABLET (25 MG TOTAL) BY MOUTH DAILY. 90 tablet 2  . lisinopril (PRINIVIL,ZESTRIL) 40 MG tablet Take 40 mg by mouth daily.    . Multiple Vitamins-Minerals (PRESERVISION AREDS PO) Take by mouth 2 (two) times daily.    . sildenafil (REVATIO) 20 MG tablet Take 20 mg by mouth as needed.    . simvastatin (ZOCOR) 40 MG tablet Take 40 mg by mouth every evening.    . vitamin C (ASCORBIC ACID) 500 MG tablet Take 500 mg by mouth daily.     No current facility-administered medications on file prior to visit.     Allergies  Allergen Reactions  . Codeine     Past Medical History:  Diagnosis Date  . Allergic rhinitis   . Anxiety   . Hemorrhoids   . HTN (hypertension)   . Hypertriglyceridemia    Mild  . Impaired fasting glucose   . LBBB (left bundle branch block) 12/12/2012    Past Surgical History:  Procedure Laterality Date  . COLONOSCOPY    . HERNIA REPAIR  1995  . TONSILLECTOMY  1947    History  Smoking Status  . Former Smoker  . Quit date: 09/19/1976  Smokeless Tobacco  . Never  Used    History  Alcohol Use No    Family History  Problem Relation Age of Onset  . Heart attack Mother   . Skin cancer Mother   . Diabetes Mother   . Hypertension Mother     Review of Systems: As noted in history of present illness.  All other systems were reviewed and are negative.  Physical Exam: BP 131/71   Pulse 76   Ht 6' (1.829 m)   Wt 207 lb (93.9 kg)   BMI 28.07 kg/m  The patient is alert and oriented x 3.    The HEENT is unremarkable. The carotids are 2+ without bruits.  There is no thyromegaly.  There is no JVD.  The lungs are clear.  The heart exam reveals a regular rate with a normal S1 and S2.  There are no murmurs, gallops, or rubs.  The PMI is not displaced.     LE- no edema.  The distal pulses are intact.  Cranial nerves II - XII are intact.  Motor and sensory functions are intact.  The gait is normal.  LABORATORY DATA:  Lab Results  Component Value Date   GLUCOSE 90 05/30/2012   NA 140 05/30/2012   K 4.2  05/30/2012   CL 103 05/30/2012   CREATININE 1.0 05/30/2012   BUN 28 (H) 05/30/2012   CO2 29 05/30/2012   Labs reviewed from primary care: 02/09/17: cholesterol 121, triglycerides 106, HDL 42, LDL 58. Glucose 113. Other chemistries, CBC, TSH normal. PSA 7.44. A1c 5.7%  Ecg today shows NSR rate 76. LBBB. I have personally reviewed and interpreted this study.  Assessment / Plan: 1. Hypertension. Blood pressure is  well controlled on combination of carvedilol, lisinopril, and HCTZ.  Labs are stable.   2. Left bundle branch block- intermittent. No ischemia noted on prior stress testing. Ejection fraction was normal by prior echo.   3. Hypercholesterolemia. On statin with excellent results.  Follow up in 6 months.

## 2017-02-20 ENCOUNTER — Ambulatory Visit (INDEPENDENT_AMBULATORY_CARE_PROVIDER_SITE_OTHER): Payer: Medicare Other | Admitting: Cardiology

## 2017-02-20 ENCOUNTER — Encounter: Payer: Self-pay | Admitting: Cardiology

## 2017-02-20 VITALS — BP 131/71 | HR 76 | Ht 72.0 in | Wt 207.0 lb

## 2017-02-20 DIAGNOSIS — I10 Essential (primary) hypertension: Secondary | ICD-10-CM

## 2017-02-20 DIAGNOSIS — I447 Left bundle-branch block, unspecified: Secondary | ICD-10-CM

## 2017-02-20 DIAGNOSIS — E7800 Pure hypercholesterolemia, unspecified: Secondary | ICD-10-CM | POA: Diagnosis not present

## 2017-02-20 NOTE — Patient Instructions (Signed)
Continue your current therapy  I will see you in 6 months.   

## 2017-03-14 DIAGNOSIS — L570 Actinic keratosis: Secondary | ICD-10-CM | POA: Diagnosis not present

## 2017-03-14 DIAGNOSIS — Z85828 Personal history of other malignant neoplasm of skin: Secondary | ICD-10-CM | POA: Diagnosis not present

## 2017-03-14 DIAGNOSIS — D0439 Carcinoma in situ of skin of other parts of face: Secondary | ICD-10-CM | POA: Diagnosis not present

## 2017-03-14 DIAGNOSIS — L821 Other seborrheic keratosis: Secondary | ICD-10-CM | POA: Diagnosis not present

## 2017-04-03 ENCOUNTER — Telehealth: Payer: Self-pay | Admitting: Cardiology

## 2017-04-03 MED ORDER — CARVEDILOL 25 MG PO TABS: 25.0000 mg | ORAL_TABLET | Freq: Two times a day (BID) | ORAL | 1 refills | Status: DC

## 2017-04-03 NOTE — Telephone Encounter (Signed)
Called requested Rx to [facility], I have notified caller as well, who verbalized thanks. I offered to send Rx to local pharmacy as well, she declined need for this, but aware to call if this is needed in the future.

## 2017-04-03 NOTE — Telephone Encounter (Signed)
New message      *STAT* If patient is at the pharmacy, call can be transferred to refill team.   1. Which medications need to be refilled? (please list name of each medication and dose if known)  Carvedilol 25mg   2. Which pharmacy/location (including street and city if local pharmacy) is medication to be sent to? [facility] 3. Do they need a 30 day or 90 day supply? 90 day------Pt is almost out of medication.  Please call wife and let her know presc has been called in to pharmacy

## 2017-04-18 DIAGNOSIS — H31092 Other chorioretinal scars, left eye: Secondary | ICD-10-CM | POA: Diagnosis not present

## 2017-04-18 DIAGNOSIS — H11011 Amyloid pterygium of right eye: Secondary | ICD-10-CM | POA: Diagnosis not present

## 2017-04-18 DIAGNOSIS — H04123 Dry eye syndrome of bilateral lacrimal glands: Secondary | ICD-10-CM | POA: Diagnosis not present

## 2017-04-18 DIAGNOSIS — B399 Histoplasmosis, unspecified: Secondary | ICD-10-CM | POA: Diagnosis not present

## 2017-04-18 DIAGNOSIS — H2513 Age-related nuclear cataract, bilateral: Secondary | ICD-10-CM | POA: Diagnosis not present

## 2017-06-07 DIAGNOSIS — Z23 Encounter for immunization: Secondary | ICD-10-CM | POA: Diagnosis not present

## 2017-06-13 DIAGNOSIS — L821 Other seborrheic keratosis: Secondary | ICD-10-CM | POA: Diagnosis not present

## 2017-06-13 DIAGNOSIS — L820 Inflamed seborrheic keratosis: Secondary | ICD-10-CM | POA: Diagnosis not present

## 2017-06-13 DIAGNOSIS — D1801 Hemangioma of skin and subcutaneous tissue: Secondary | ICD-10-CM | POA: Diagnosis not present

## 2017-06-13 DIAGNOSIS — B078 Other viral warts: Secondary | ICD-10-CM | POA: Diagnosis not present

## 2017-06-13 DIAGNOSIS — L570 Actinic keratosis: Secondary | ICD-10-CM | POA: Diagnosis not present

## 2017-06-13 DIAGNOSIS — Z85828 Personal history of other malignant neoplasm of skin: Secondary | ICD-10-CM | POA: Diagnosis not present

## 2017-07-24 ENCOUNTER — Telehealth: Payer: Self-pay | Admitting: Cardiology

## 2017-07-24 MED ORDER — CARVEDILOL 25 MG PO TABS: 25.0000 mg | ORAL_TABLET | Freq: Two times a day (BID) | ORAL | 1 refills | Status: DC

## 2017-07-24 NOTE — Telephone Encounter (Signed)
Returned the call to the patient's wife, per the dpr. She stated that she had tried to make a follow up appointment for her husband several months ago but the schedule had not been open. She was put on a recall list for December. She tried calling today and was not able to get an appointment until the end of January. She stated that they are very frustrated. Carvedilol has been refilled per her request to [facility]. Will route to the provider's nurse for her knowledge.

## 2017-07-24 NOTE — Telephone Encounter (Signed)
New message     Please call patient wife is very upset about schedule

## 2017-07-27 NOTE — Telephone Encounter (Signed)
Spoke to patient's wife.Follow up appointment scheduled with Dr.Jordan 08/08/17 at 1:40 pm.

## 2017-07-28 ENCOUNTER — Encounter: Payer: Self-pay | Admitting: Cardiology

## 2017-08-05 NOTE — Progress Notes (Signed)
Evan Mitchell Date of Birth: 04/10/1946 Medical Record #[id number]  History of Present Illness: Evan Mitchell is seen today for followup of his hypertension and hyperlipidemia.   He is doing very well. He brings BP recordings from home and has been well controlled. 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 He denies any symptoms of dyspnea or chest pain. He's had no edema.   Current Outpatient Medications on File Prior to Visit  Medication Sig Dispense Refill  . allopurinol (ZYLOPRIM) 100 MG tablet Take 300 mg by mouth as needed.     Marland Kitchen aspirin EC 81 MG tablet Take 81 mg by mouth daily.    . Calcium Carbonate-Vitamin D (CALCIUM + D PO) Take 1,200 mg by mouth daily.    . carvedilol (COREG) 25 MG tablet Take 1 tablet (25 mg total) 2 (two) times daily with a meal by mouth. 180 tablet 1  . GLUCOSAMINE-CHONDROITIN PO Take by mouth 3 (three) times daily.    . hydrochlorothiazide (HYDRODIURIL) 25 MG tablet TAKE 1 TABLET (25 MG TOTAL) BY MOUTH DAILY. 90 tablet 2  . lisinopril (PRINIVIL,ZESTRIL) 40 MG tablet Take 40 mg by mouth daily.    . Multiple Vitamins-Minerals (PRESERVISION AREDS PO) Take by mouth 2 (two) times daily.    . sildenafil (REVATIO) 20 MG tablet Take 20 mg by mouth as needed.    . simvastatin (ZOCOR) 40 MG tablet Take 40 mg by mouth every evening.    . vitamin C (ASCORBIC ACID) 500 MG tablet Take 500 mg by mouth daily.     No current facility-administered medications on file prior to visit.     Allergies  Allergen Reactions  . Codeine     Past Medical History:  Diagnosis Date  . Allergic rhinitis   . Anxiety   . Hemorrhoids   . HTN (hypertension)   . Hypertriglyceridemia    Mild  . Impaired fasting glucose   . LBBB (left bundle branch block) 12/12/2012    Past Surgical History:  Procedure Laterality Date  . COLONOSCOPY    . HERNIA REPAIR  1995  . TONSILLECTOMY  1947    Social History   Tobacco Use  Smoking Status Former Smoker  . Last attempt to  quit: 09/19/1976  . Years since quitting: 40.9  Smokeless Tobacco Never Used    Social History   Substance and Sexual Activity  Alcohol Use No  . Alcohol/week: 0.0 oz    Family History  Problem Relation Age of Onset  . Heart attack Mother   . Skin cancer Mother   . Diabetes Mother   . Hypertension Mother     Review of Systems: As noted in history of present illness.  All other systems were reviewed and are negative.  Physical Exam: BP 126/72   Pulse 83   Ht 6' (1.829 m)   Wt 207 lb (93.9 kg)   BMI 28.07 kg/m  GENERAL:  Well appearing HEENT:  PERRL, EOMI, sclera are clear. Oropharynx is clear. NECK:  No jugular venous distention, carotid upstroke brisk and symmetric, no bruits, no thyromegaly or adenopathy LUNGS:  Clear to auscultation bilaterally CHEST:  Unremarkable HEART:  RRR,  PMI not displaced or sustained,S1 and S2 within normal limits, no S3, no S4: no clicks, no rubs, no murmurs ABD:  Soft, nontender. BS +, no masses or bruits. No hepatomegaly, no splenomegaly EXT:  2 + pulses throughout, no edema, no cyanosis no clubbing SKIN:  Warm and dry.  No  rashes NEURO:  Alert and oriented x 3. Cranial nerves II through XII intact. PSYCH:  Cognitively intact    LABORATORY DATA:  Lab Results  Component Value Date   GLUCOSE 90 05/30/2012   NA 140 05/30/2012   K 4.2 05/30/2012   CL 103 05/30/2012   CREATININE 1.0 05/30/2012   BUN 28 (H) 05/30/2012   CO2 29 05/30/2012   Labs reviewed from primary care: 02/09/17: cholesterol 121, triglycerides 106, HDL 42, LDL 58. Glucose 113. Other chemistries, CBC, TSH normal. PSA 7.44. A1c 5.7%   Assessment / Plan: 1. Hypertension. Blood pressure is  well controlled on combination of carvedilol, lisinopril, and HCTZ.   2. Left bundle branch block- intermittent. No ischemia noted on prior stress testing. Ejection fraction was normal by prior echo.   3. Hypercholesterolemia. On statin with excellent results.  Follow up in 6  months.

## 2017-08-08 ENCOUNTER — Encounter: Payer: Self-pay | Admitting: Cardiology

## 2017-08-08 ENCOUNTER — Ambulatory Visit: Payer: Medicare Other | Admitting: Cardiology

## 2017-08-08 ENCOUNTER — Ambulatory Visit (INDEPENDENT_AMBULATORY_CARE_PROVIDER_SITE_OTHER): Payer: Medicare Other | Admitting: Cardiology

## 2017-08-08 VITALS — BP 126/72 | HR 83 | Ht 72.0 in | Wt 207.0 lb

## 2017-08-08 DIAGNOSIS — E7800 Pure hypercholesterolemia, unspecified: Secondary | ICD-10-CM

## 2017-08-08 DIAGNOSIS — I10 Essential (primary) hypertension: Secondary | ICD-10-CM

## 2017-08-08 DIAGNOSIS — I447 Left bundle-branch block, unspecified: Secondary | ICD-10-CM

## 2017-08-08 NOTE — Patient Instructions (Signed)
Continue your current therapy  I will see you in 6 months.   

## 2017-08-17 DIAGNOSIS — Z6828 Body mass index (BMI) 28.0-28.9, adult: Secondary | ICD-10-CM | POA: Diagnosis not present

## 2017-08-17 DIAGNOSIS — R7301 Impaired fasting glucose: Secondary | ICD-10-CM | POA: Diagnosis not present

## 2017-08-17 DIAGNOSIS — E781 Pure hyperglyceridemia: Secondary | ICD-10-CM | POA: Diagnosis not present

## 2017-08-17 DIAGNOSIS — I10 Essential (primary) hypertension: Secondary | ICD-10-CM | POA: Diagnosis not present

## 2017-08-17 DIAGNOSIS — M109 Gout, unspecified: Secondary | ICD-10-CM | POA: Diagnosis not present

## 2017-08-17 DIAGNOSIS — R972 Elevated prostate specific antigen [PSA]: Secondary | ICD-10-CM | POA: Diagnosis not present

## 2017-10-18 DIAGNOSIS — H04123 Dry eye syndrome of bilateral lacrimal glands: Secondary | ICD-10-CM | POA: Diagnosis not present

## 2017-10-18 DIAGNOSIS — H11011 Amyloid pterygium of right eye: Secondary | ICD-10-CM | POA: Diagnosis not present

## 2017-10-18 DIAGNOSIS — H2513 Age-related nuclear cataract, bilateral: Secondary | ICD-10-CM | POA: Diagnosis not present

## 2017-10-18 DIAGNOSIS — H31092 Other chorioretinal scars, left eye: Secondary | ICD-10-CM | POA: Diagnosis not present

## 2017-10-20 DIAGNOSIS — Z87898 Personal history of other specified conditions: Secondary | ICD-10-CM

## 2017-10-20 HISTORY — DX: Personal history of other specified conditions: Z87.898

## 2017-11-01 DIAGNOSIS — R972 Elevated prostate specific antigen [PSA]: Secondary | ICD-10-CM | POA: Diagnosis not present

## 2017-11-09 ENCOUNTER — Other Ambulatory Visit: Payer: Self-pay

## 2017-11-09 ENCOUNTER — Emergency Department (HOSPITAL_BASED_OUTPATIENT_CLINIC_OR_DEPARTMENT_OTHER): Payer: Medicare Other

## 2017-11-09 ENCOUNTER — Encounter (HOSPITAL_BASED_OUTPATIENT_CLINIC_OR_DEPARTMENT_OTHER): Payer: Self-pay | Admitting: *Deleted

## 2017-11-09 ENCOUNTER — Emergency Department (HOSPITAL_BASED_OUTPATIENT_CLINIC_OR_DEPARTMENT_OTHER)
Admission: EM | Admit: 2017-11-09 | Discharge: 2017-11-09 | Disposition: A | Discharge status: Home / Self Care | Payer: Medicare Other | Attending: Emergency Medicine | Admitting: Emergency Medicine

## 2017-11-09 DIAGNOSIS — Y9389 Activity, other specified: Secondary | ICD-10-CM | POA: Insufficient documentation

## 2017-11-09 DIAGNOSIS — Z79899 Other long term (current) drug therapy: Secondary | ICD-10-CM | POA: Insufficient documentation

## 2017-11-09 DIAGNOSIS — S299XXA Unspecified injury of thorax, initial encounter: Secondary | ICD-10-CM | POA: Diagnosis not present

## 2017-11-09 DIAGNOSIS — R0781 Pleurodynia: Secondary | ICD-10-CM | POA: Diagnosis not present

## 2017-11-09 DIAGNOSIS — Z87891 Personal history of nicotine dependence: Secondary | ICD-10-CM | POA: Insufficient documentation

## 2017-11-09 DIAGNOSIS — I10 Essential (primary) hypertension: Secondary | ICD-10-CM | POA: Diagnosis not present

## 2017-11-09 DIAGNOSIS — Z7982 Long term (current) use of aspirin: Secondary | ICD-10-CM | POA: Diagnosis not present

## 2017-11-09 DIAGNOSIS — S39012A Strain of muscle, fascia and tendon of lower back, initial encounter: Secondary | ICD-10-CM | POA: Diagnosis not present

## 2017-11-09 DIAGNOSIS — W19XXXA Unspecified fall, initial encounter: Secondary | ICD-10-CM | POA: Diagnosis not present

## 2017-11-09 DIAGNOSIS — E860 Dehydration: Secondary | ICD-10-CM | POA: Diagnosis not present

## 2017-11-09 DIAGNOSIS — Y92002 Bathroom of unspecified non-institutional (private) residence single-family (private) house as the place of occurrence of the external cause: Secondary | ICD-10-CM | POA: Insufficient documentation

## 2017-11-09 DIAGNOSIS — M25551 Pain in right hip: Secondary | ICD-10-CM | POA: Diagnosis not present

## 2017-11-09 DIAGNOSIS — R55 Syncope and collapse: Secondary | ICD-10-CM | POA: Diagnosis not present

## 2017-11-09 DIAGNOSIS — M25512 Pain in left shoulder: Secondary | ICD-10-CM | POA: Diagnosis not present

## 2017-11-09 DIAGNOSIS — R195 Other fecal abnormalities: Secondary | ICD-10-CM | POA: Insufficient documentation

## 2017-11-09 DIAGNOSIS — R197 Diarrhea, unspecified: Secondary | ICD-10-CM | POA: Diagnosis not present

## 2017-11-09 DIAGNOSIS — Y999 Unspecified external cause status: Secondary | ICD-10-CM | POA: Insufficient documentation

## 2017-11-09 DIAGNOSIS — S4992XA Unspecified injury of left shoulder and upper arm, initial encounter: Secondary | ICD-10-CM | POA: Diagnosis not present

## 2017-11-09 DIAGNOSIS — S79911A Unspecified injury of right hip, initial encounter: Secondary | ICD-10-CM | POA: Diagnosis not present

## 2017-11-09 LAB — CBC
HCT: 46.9 % (ref 39.0–52.0)
HEMOGLOBIN: 15.8 g/dL (ref 13.0–17.0)
MCH: 29 pg (ref 26.0–34.0)
MCHC: 33.7 g/dL (ref 30.0–36.0)
MCV: 86.1 fL (ref 78.0–100.0)
Platelets: 110 10*3/uL — ABNORMAL LOW (ref 150–400)
RBC: 5.45 MIL/uL (ref 4.22–5.81)
RDW: 14.6 % (ref 11.5–15.5)
WBC: 9.2 10*3/uL (ref 4.0–10.5)

## 2017-11-09 LAB — BASIC METABOLIC PANEL
ANION GAP: 9 (ref 5–15)
BUN: 26 mg/dL — ABNORMAL HIGH (ref 6–20)
CHLORIDE: 106 mmol/L (ref 101–111)
CO2: 20 mmol/L — AB (ref 22–32)
Calcium: 8.4 mg/dL — ABNORMAL LOW (ref 8.9–10.3)
Creatinine, Ser: 0.95 mg/dL (ref 0.61–1.24)
GFR calc non Af Amer: >60 mL/min (ref >60)
GLUCOSE: 170 mg/dL — AB (ref 65–99)
Potassium: 3.7 mmol/L (ref 3.5–5.1)
Sodium: 135 mmol/L (ref 135–145)

## 2017-11-09 LAB — URINALYSIS, ROUTINE W REFLEX MICROSCOPIC
Bilirubin Urine: NEGATIVE
Glucose, UA: NEGATIVE mg/dL
Ketones, ur: NEGATIVE mg/dL
Leukocytes, UA: NEGATIVE
Nitrite: NEGATIVE
Protein, ur: NEGATIVE mg/dL
Specific Gravity, Urine: 1.025 (ref 1.005–1.030)
pH: 5.5 (ref 5.0–8.0)

## 2017-11-09 LAB — URINALYSIS, MICROSCOPIC (REFLEX)

## 2017-11-09 LAB — TROPONIN I: Troponin I: <0.03 ng/mL

## 2017-11-09 MED ORDER — ACETAMINOPHEN 325 MG PO TABS
650.0000 mg | ORAL_TABLET | Freq: Once | ORAL | Status: AC
  Administered 2017-11-09: 650 mg via ORAL
  Filled 2017-11-09: qty 2

## 2017-11-09 MED ORDER — SODIUM CHLORIDE 0.9 % IV BOLUS (SEPSIS)
500.0000 mL | Freq: Once | INTRAVENOUS | Status: AC
  Administered 2017-11-09: 500 mL via INTRAVENOUS

## 2017-11-09 MED ORDER — ONDANSETRON 4 MG PO TBDP: 4.0000 mg | ORAL_TABLET | Freq: Three times a day (TID) | ORAL | 0 refills | Status: DC | PRN

## 2017-11-09 MED ORDER — ONDANSETRON HCL 4 MG/2ML IJ SOLN
4.0000 mg | Freq: Once | INTRAMUSCULAR | Status: AC
Start: 2017-11-09 — End: 2017-11-09
  Administered 2017-11-09: 4 mg via INTRAVENOUS
  Filled 2017-11-09: qty 2

## 2017-11-09 MED FILL — ONDANSETRON ODT 4 MG TABLET: 4 | 5 days supply | Qty: 15 | Fill #0

## 2017-11-09 NOTE — ED Provider Notes (Signed)
[facility] Provider Note   CSN: [id number] Arrival date & time: 11/09/17  1100     History   Chief Complaint Chief Complaint  Patient presents with  . Loss of Consciousness    HPI Evan Mitchell is a 72 y.o. male who presents after passing out while urinating this morning.  HPI  Patient passed out while urinating this morning. His wife heard a loud slamming sound and found patient down on the ground. He fell onto his left side and hit the back of his head. He was asking her questions immediately afterwards and was not confused. He reports having some blackening of his vision just prior to passing out; he does not remember passing out. He had no loss of bowel function but had some spilled urine (presumed) due to passing out while urinating. He started having loose stools last night and some stomach upset. 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 He has not had fevers but has had several days of congestion. He denies feeling short of breath. His wife took his temperature this morning and says it was 100 F but then 100.5 F after the fall. Wife says he looked pale this morning, and she'd told him not to go to work. Denies shortness of breath, orthopnea.  Presently he complains mostly of headache and right lower back pain.   Denies pain with urination or straining. He says he is followed for high PSA but that the numbers have been decreasing and he has no symptoms.   Past Medical History:  Diagnosis Date  . Allergic rhinitis   . Anxiety   . Hemorrhoids   . HTN (hypertension)   . Hypertriglyceridemia    Mild  . Impaired fasting glucose   . LBBB (left bundle branch block) 12/12/2012    Patient Active Problem List   Diagnosis Date Noted  . LBBB (left bundle branch block) 12/12/2012  . Hypertension, uncontrolled 01/10/2012  . Pre-diabetes 01/10/2012  . Hyperlipidemia 01/10/2012     Past Surgical History:  Procedure Laterality Date  . COLONOSCOPY    . HERNIA REPAIR  1995  . TONSILLECTOMY  1947       Home Medications    Prior to Admission medications   Medication Sig Start Date End Date Taking? Authorizing Provider  allopurinol (ZYLOPRIM) 100 MG tablet Take 300 mg by mouth as needed.  06/04/13   [provider]  aspirin EC 81 MG tablet Take 81 mg by mouth daily.    [provider]  Calcium Carbonate-Vitamin D (CALCIUM + D PO) Take 1,200 mg by mouth daily.    [provider]  carvedilol (COREG) 25 MG tablet Take 1 tablet (25 mg total) 2 (two) times daily with a meal by mouth. 07/24/17   Martinique, Peter M, MD  GLUCOSAMINE-CHONDROITIN PO Take by mouth 3 (three) times daily.    [provider]  hydrochlorothiazide (HYDRODIURIL) 25 MG tablet TAKE 1 TABLET (25 MG TOTAL) BY MOUTH DAILY. 02/24/16   Martinique, Peter M, MD  lisinopril (PRINIVIL,ZESTRIL) 40 MG tablet Take 40 mg by mouth daily.    [provider]  Multiple Vitamins-Minerals (PRESERVISION AREDS PO) Take by mouth 2 (two) times daily.    [provider]  ondansetron (ZOFRAN ODT) 4 MG disintegrating tablet Take 1 tablet (4 mg total) by mouth every 8 (eight) hours as needed for nausea or vomiting. 11/09/17   Schlossman,  Junie Panning, MD  sildenafil (REVATIO) 20 MG tablet Take 20 mg by mouth as needed.    [provider]  simvastatin (ZOCOR) 40 MG tablet Take 40 mg by mouth every evening.    [provider]  vitamin C (ASCORBIC ACID) 500 MG tablet Take 500 mg by mouth daily.    [provider]    Family History Family History  Problem Relation Age of Onset  . Heart attack Mother   . Skin cancer Mother   . Diabetes Mother   . Hypertension Mother     Social History Social History   Tobacco Use  . Smoking status: Former Smoker    Last attempt to quit: 09/19/1976    Years since quitting: 41.1  . Smokeless tobacco: Never Used  Substance Use  Topics  . Alcohol use: No    Alcohol/week: 0.0 oz  . Drug use: No     Allergies   Codeine   Review of Systems Review of Systems  Constitutional: Positive for fever.  HENT: Positive for congestion. Negative for sore throat.   Respiratory: Positive for cough (mild). Negative for shortness of breath.   Cardiovascular: Negative for chest pain and leg swelling.  Gastrointestinal: Positive for abdominal pain and diarrhea. Negative for vomiting.  Genitourinary: Negative for dysuria and frequency.  Musculoskeletal: Positive for back pain. Negative for myalgias.  Skin: Positive for wound.  Neurological: Positive for headaches (after falling). Negative for weakness.     Physical Exam Updated Vital Signs BP 113/67 (BP Location: Right Arm)   Pulse 88   Temp 98.6 F (37 C) (Oral)   Resp (!) 24   Ht 6' (1.829 m)   Wt 92.1 kg (203 lb)   SpO2 93%   BMI 27.53 kg/m   Physical Exam  Constitutional: He is oriented to person, place, and time. He appears well-developed and well-nourished. No distress.  Patient sitting back in bed, interactive and joking.  HENT:  Head: Normocephalic.  Right Ear: External ear normal.  Left Ear: External ear normal.  Mouth/Throat: Oropharynx is clear and moist.  Mild nasal congestion.   Eyes: Conjunctivae and EOM are normal. Pupils are equal, round, and reactive to light.  Neck: Normal range of motion. Neck supple.  Cardiovascular: Normal rate, regular rhythm, normal heart sounds and intact distal pulses.  No murmur heard. Pulmonary/Chest: Effort normal and breath sounds normal. He has no wheezes. He exhibits no tenderness.  Abdominal: Soft. He exhibits no mass. There is no tenderness. There is no guarding.  ++BS  Musculoskeletal: Normal range of motion. He exhibits no edema.  No pain over greater trochanters. Increased muscle tension over right lower paraspinal muscle.   Lymphadenopathy:    He has no cervical adenopathy.  Neurological: He is alert  and oriented to person, place, and time. No cranial nerve deficit or sensory deficit. He exhibits normal muscle tone. Coordination normal.  Skin: Skin is warm and dry.  Abrasion over left upper back. Superficial ~2cm abrasion over occiput, not bleeding.  Psychiatric: He has a normal mood and affect.  Nursing note and vitals reviewed.    ED Treatments / Results  Labs (all labs ordered are listed, but only abnormal results are displayed) Labs Reviewed  BASIC METABOLIC PANEL - Abnormal; Notable for the following components:      Result Value   CO2 20 (*)    Glucose, Bld 170 (*)    BUN 26 (*)    Calcium 8.4 (*)    All other components  within normal limits  CBC - Abnormal; Notable for the following components:   Platelets 110 (*)    All other components within normal limits  URINALYSIS, ROUTINE W REFLEX MICROSCOPIC - Abnormal; Notable for the following components:   Hgb urine dipstick SMALL (*)    All other components within normal limits  URINALYSIS, MICROSCOPIC (REFLEX) - Abnormal; Notable for the following components:   Bacteria, UA RARE (*)    Squamous Epithelial / LPF 0-5 (*)    All other components within normal limits  TROPONIN I    EKG  EKG Interpretation  Date/Time:  Thursday November 09 2017 11:16:14 EST Ventricular Rate:  90 PR Interval:    QRS Duration: 151 QT Interval:  407 QTC Calculation: 498 R Axis:   0 Text Interpretation:  Sinus rhythm Probable left atrial enlargement Left bundle branch block No significant change since last tracing Confirmed by Gareth Morgan [phone]) on 11/09/2017 11:31:56 AM       Radiology Dg Ribs Unilateral W/chest Left  Result Date: 11/09/2017 CLINICAL DATA:  Fall, left rib pain EXAM: LEFT RIBS AND CHEST - 3+ VIEW COMPARISON:  None. FINDINGS: Bibasilar atelectasis. Heart is normal size. No effusions or pneumothorax. No visible left rib fracture. IMPRESSION: Bibasilar atelectasis. No visible rib fracture. Electronically Signed   By:  Rolm Baptise M.D.   On: 11/09/2017 12:05   Ct Head Wo Contrast  Result Date: 11/09/2017 CLINICAL DATA:  Syncopal episode during micturition. Occipital laceration. EXAM: CT HEAD WITHOUT CONTRAST TECHNIQUE: Contiguous axial images were obtained from the base of the skull through the vertex without intravenous contrast. COMPARISON:  None. FINDINGS: Brain: The brain shows a normal appearance without evidence of malformation, atrophy, old or acute small or large vessel infarction, mass lesion, hemorrhage, hydrocephalus or extra-axial collection. Vascular: There is atherosclerotic calcification of the major vessels at the base of the brain. Skull: Normal.  No traumatic finding.  No focal bone lesion. Sinuses/Orbits: Sinuses are clear. Orbits appear normal. Mastoids are clear. Other: None significant IMPRESSION: Normal head CT for age.  No acute finding.  No traumatic finding. Electronically Signed   By: Nelson Chimes M.D.   On: 11/09/2017 11:40   Dg Shoulder Left  Result Date: 11/09/2017 CLINICAL DATA:  Syncope.  Fall.  Left shoulder pain EXAM: LEFT SHOULDER - 2+ VIEW COMPARISON:  None. FINDINGS: Degenerative changes in the [facility] joint with joint space narrowing and spurring. Glenohumeral joint is maintained. No acute bony abnormality. Specifically, no fracture, subluxation, or dislocation. Soft tissues are intact. IMPRESSION: No acute bony abnormality Electronically Signed   By: Rolm Baptise M.D.   On: 11/09/2017 12:04   Dg Hip Unilat W Or Wo Pelvis 2-3 Views Right  Result Date: 11/09/2017 CLINICAL DATA:  Right hip pain after fall. EXAM: DG HIP (WITH OR WITHOUT PELVIS) 2-3V RIGHT COMPARISON:  None. FINDINGS: There is no evidence of hip fracture or dislocation. There is no evidence of arthropathy or other focal bone abnormality. Degenerative changes of the lower lumbar spine. Vascular calcifications. IMPRESSION: Negative. Electronically Signed   By: Titus Dubin M.D.   On: 11/09/2017 12:05     Procedures Procedures (including critical care time)  Medications Ordered in ED Medications  acetaminophen (TYLENOL) tablet 650 mg (650 mg Oral Given 11/09/17 1314)  sodium chloride 0.9 % bolus 500 mL (0 mLs Intravenous Stopped 11/09/17 1415)  ondansetron (ZOFRAN) injection 4 mg (4 mg Intravenous Given 11/09/17 1313)     Initial Impression / Assessment and Plan / ED Course  I have reviewed the triage vital signs and the nursing notes.  Pertinent labs & imaging results that were available during my care of the patient were reviewed by me and considered in my medical decision making (see chart for details).  CT head normal. AOx3 on exam and no focal deficits.   Patient's EKG unchanged. Troponin negative (obtained as part of basic syncope work-up but no complaint of chest pain and has had negative stress testing since LBBB initially noted)  CXR without cardiomegaly or signs of pneumonia.   Left shoulder and right hip xray negative for dislocation or fracture.  Patient given 500 cc fluid bolus given diarrhea and syncope.   Final Clinical Impressions(s) / ED Diagnoses   Final diagnoses:  Syncope, unspecified syncope type  Dehydration  Loose stools  Strain of lumbar region, initial encounter   Patient with syncopal episode in setting of loose stools and low-grade fever. Suspect vasovagal syncope. EKG unchanged with negative troponin and no confusion after episode, making acute MI or seizure less likely. Encouraged to drink plenty of fluids. Encouraged patient to follow-up with his PCP.   ED Discharge Orders        Ordered    ondansetron (ZOFRAN ODT) 4 MG disintegrating tablet  Every 8 hours PRN     11/09/17 1353       Olene Floss [place], MD 11/09/17 1751    Gareth Morgan, MD 11/12/17 [phone]

## 2017-11-09 NOTE — ED Triage Notes (Signed)
He passed out this am and hit the back of his head. Laceration to the back of his head. Diarrhea this am before he passed out. Dry heaves at triage. He is alert oriented.

## 2017-11-09 NOTE — ED Notes (Signed)
Patient has noted abrasions to the left shoulder  - also pain to his left ribs, and right hip region

## 2017-11-09 NOTE — Discharge Instructions (Addendum)
Evan Mitchell,  You likely fell due to being volume down from loose stools and perhaps having early signs of a respiratory virus. Please drink plenty of fluids.

## 2017-11-09 NOTE — ED Notes (Signed)
Patient transported to CT 

## 2017-11-10 DIAGNOSIS — W19XXXA Unspecified fall, initial encounter: Secondary | ICD-10-CM | POA: Diagnosis not present

## 2017-11-10 DIAGNOSIS — M25551 Pain in right hip: Secondary | ICD-10-CM | POA: Diagnosis not present

## 2017-11-10 DIAGNOSIS — Z6828 Body mass index (BMI) 28.0-28.9, adult: Secondary | ICD-10-CM | POA: Diagnosis not present

## 2017-11-10 DIAGNOSIS — J111 Influenza due to unidentified influenza virus with other respiratory manifestations: Secondary | ICD-10-CM | POA: Diagnosis not present

## 2017-12-13 DIAGNOSIS — D1801 Hemangioma of skin and subcutaneous tissue: Secondary | ICD-10-CM | POA: Diagnosis not present

## 2017-12-13 DIAGNOSIS — L814 Other melanin hyperpigmentation: Secondary | ICD-10-CM | POA: Diagnosis not present

## 2017-12-13 DIAGNOSIS — L821 Other seborrheic keratosis: Secondary | ICD-10-CM | POA: Diagnosis not present

## 2017-12-13 DIAGNOSIS — D485 Neoplasm of uncertain behavior of skin: Secondary | ICD-10-CM | POA: Diagnosis not present

## 2017-12-13 DIAGNOSIS — Z85828 Personal history of other malignant neoplasm of skin: Secondary | ICD-10-CM | POA: Diagnosis not present

## 2017-12-13 DIAGNOSIS — D225 Melanocytic nevi of trunk: Secondary | ICD-10-CM | POA: Diagnosis not present

## 2017-12-13 DIAGNOSIS — D2272 Melanocytic nevi of left lower limb, including hip: Secondary | ICD-10-CM | POA: Diagnosis not present

## 2018-02-10 NOTE — Progress Notes (Signed)
[name] Date of Birth: 06-30-1946 Medical Record #[id number]  History of Present Illness: Evan Mitchell is seen today for followup of his hypertension and hyperlipidemia.   He is doing very well. He brings BP recordings from home and has been well controlled. 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 He denies any symptoms of dyspnea or chest pain. He's had no edema.   He was seen in the ED in February after a syncopal episode. He developed fever and felt sick to his stomach. Went to [facility] to urinate and passed out. Afterwards had dry heaves. In ED was given IV fluids and DC home. No problems since then.  Current Outpatient Medications on File Prior to Visit  Medication Sig Dispense Refill  . allopurinol (ZYLOPRIM) 300 MG tablet Take 1 tablet by mouth daily.    Marland Kitchen aspirin EC 81 MG tablet Take 81 mg by mouth daily.    . Calcium Carbonate-Vitamin D (CALCIUM + D PO) Take 1,200 mg by mouth daily.    . carvedilol (COREG) 25 MG tablet Take 1 tablet (25 mg total) 2 (two) times daily with a meal by mouth. 180 tablet 1  . GLUCOSAMINE-CHONDROITIN PO Take by mouth 3 (three) times daily.    . hydrochlorothiazide (HYDRODIURIL) 25 MG tablet TAKE 1 TABLET (25 MG TOTAL) BY MOUTH DAILY. 90 tablet 2  . lisinopril (PRINIVIL,ZESTRIL) 40 MG tablet Take 40 mg by mouth daily.    . Multiple Vitamins-Minerals (PRESERVISION AREDS PO) Take by mouth 2 (two) times daily.    . ondansetron (ZOFRAN ODT) 4 MG disintegrating tablet Take 1 tablet (4 mg total) by mouth every 8 (eight) hours as needed for nausea or vomiting. 15 tablet 0  . simvastatin (ZOCOR) 40 MG tablet Take 40 mg by mouth every evening.    . vitamin C (ASCORBIC ACID) 500 MG tablet Take 500 mg by mouth daily.     No current facility-administered medications on file prior to visit.     Allergies  Allergen Reactions  . Codeine     Past Medical History:  Diagnosis Date  . Allergic rhinitis   . Anxiety   . Hemorrhoids   . HTN  (hypertension)   . Hypertriglyceridemia    Mild  . Impaired fasting glucose   . LBBB (left bundle branch block) 12/12/2012    Past Surgical History:  Procedure Laterality Date  . COLONOSCOPY    . HERNIA REPAIR  1995  . TONSILLECTOMY  1947    Social History   Tobacco Use  Smoking Status Former Smoker  . Last attempt to quit: 09/19/1976  . Years since quitting: 41.4  Smokeless Tobacco Never Used    Social History   Substance and Sexual Activity  Alcohol Use No  . Alcohol/week: 0.0 oz    Family History  Problem Relation Age of Onset  . Heart attack Mother   . Skin cancer Mother   . Diabetes Mother   . Hypertension Mother     Review of Systems: As noted in history of present illness.  All other systems were reviewed and are negative.  Physical Exam: BP 122/70   Pulse 72   Ht 6' (1.829 m)   Wt 208 lb 9.6 oz (94.6 kg)   BMI 28.29 kg/m  GENERAL:  Well appearing HEENT:  PERRL, EOMI, sclera are clear. Oropharynx is clear. NECK:  No jugular venous distention, carotid upstroke brisk and symmetric, no bruits, no thyromegaly or adenopathy LUNGS:  Clear to auscultation  bilaterally CHEST:  Unremarkable HEART:  RRR,  PMI not displaced or sustained,S1 and S2 within normal limits, no S3, no S4: no clicks, no rubs, no murmurs ABD:  Soft, nontender. BS +, no masses or bruits. No hepatomegaly, no splenomegaly EXT:  2 + pulses throughout, no edema, no cyanosis no clubbing SKIN:  Warm and dry.  No rashes NEURO:  Alert and oriented x 3. Cranial nerves II through XII intact. PSYCH:  Cognitively intact   LABORATORY DATA:  Lab Results  Component Value Date   WBC 9.2 11/09/2017   HGB 15.8 11/09/2017   HCT 46.9 11/09/2017   PLT 110 (L) 11/09/2017   GLUCOSE 170 (H) 11/09/2017   NA 135 11/09/2017   K 3.7 11/09/2017   CL 106 11/09/2017   CREATININE 0.95 11/09/2017   BUN 26 (H) 11/09/2017   CO2 20 (L) 11/09/2017   Labs reviewed from primary care: 02/09/17: cholesterol 121,  triglycerides 106, HDL 42, LDL 58. Glucose 113. Other chemistries, CBC, TSH normal. PSA 7.44. A1c 5.7%   Assessment / Plan: 1. Hypertension. Blood pressure is  well controlled on combination of carvedilol, lisinopril, and HCTZ.   2. Left bundle branch block- intermittent. No ischemia noted on prior stress testing. Ejection fraction was normal by prior echo.   3. Hypercholesterolemia. On statin with excellent results.  4. Syncope related to vagal response with acute GI illness/fever. No recurrence.  Follow up in 6 months.

## 2018-02-14 ENCOUNTER — Ambulatory Visit (INDEPENDENT_AMBULATORY_CARE_PROVIDER_SITE_OTHER): Payer: Medicare Other | Admitting: Cardiology

## 2018-02-14 ENCOUNTER — Encounter: Payer: Self-pay | Admitting: Cardiology

## 2018-02-14 VITALS — BP 122/70 | HR 72 | Ht 72.0 in | Wt 208.6 lb

## 2018-02-14 DIAGNOSIS — E7800 Pure hypercholesterolemia, unspecified: Secondary | ICD-10-CM

## 2018-02-14 DIAGNOSIS — I447 Left bundle-branch block, unspecified: Secondary | ICD-10-CM | POA: Diagnosis not present

## 2018-02-14 DIAGNOSIS — I10 Essential (primary) hypertension: Secondary | ICD-10-CM

## 2018-02-14 NOTE — Patient Instructions (Signed)
Medication Instructions:  Your physician recommends that you continue on your current medications as directed. Please refer to the Current Medication list given to you today.  Labwork: none  Testing/Procedures: none  Follow-Up: Your physician wants you to follow-up in: 1 year  You will receive a reminder letter in the mail two months in advance. If you don't receive a letter, please call our office to schedule the follow-up appointment.

## 2018-03-09 DIAGNOSIS — L821 Other seborrheic keratosis: Secondary | ICD-10-CM | POA: Diagnosis not present

## 2018-03-09 DIAGNOSIS — L570 Actinic keratosis: Secondary | ICD-10-CM | POA: Diagnosis not present

## 2018-03-09 DIAGNOSIS — Z85828 Personal history of other malignant neoplasm of skin: Secondary | ICD-10-CM | POA: Diagnosis not present

## 2018-03-09 DIAGNOSIS — I788 Other diseases of capillaries: Secondary | ICD-10-CM | POA: Diagnosis not present

## 2018-03-14 DIAGNOSIS — Z125 Encounter for screening for malignant neoplasm of prostate: Secondary | ICD-10-CM | POA: Diagnosis not present

## 2018-03-14 DIAGNOSIS — R7301 Impaired fasting glucose: Secondary | ICD-10-CM | POA: Diagnosis not present

## 2018-03-14 DIAGNOSIS — E781 Pure hyperglyceridemia: Secondary | ICD-10-CM | POA: Diagnosis not present

## 2018-03-14 DIAGNOSIS — R82998 Other abnormal findings in urine: Secondary | ICD-10-CM | POA: Diagnosis not present

## 2018-03-14 DIAGNOSIS — M109 Gout, unspecified: Secondary | ICD-10-CM | POA: Diagnosis not present

## 2018-03-14 DIAGNOSIS — I10 Essential (primary) hypertension: Secondary | ICD-10-CM | POA: Diagnosis not present

## 2018-03-21 DIAGNOSIS — F418 Other specified anxiety disorders: Secondary | ICD-10-CM | POA: Diagnosis not present

## 2018-03-21 DIAGNOSIS — Z0000 Encounter for general adult medical examination without abnormal findings: Secondary | ICD-10-CM | POA: Diagnosis not present

## 2018-03-21 DIAGNOSIS — I454 Nonspecific intraventricular block: Secondary | ICD-10-CM | POA: Diagnosis not present

## 2018-03-21 DIAGNOSIS — Z1389 Encounter for screening for other disorder: Secondary | ICD-10-CM | POA: Diagnosis not present

## 2018-03-21 DIAGNOSIS — R972 Elevated prostate specific antigen [PSA]: Secondary | ICD-10-CM | POA: Diagnosis not present

## 2018-03-21 DIAGNOSIS — Z125 Encounter for screening for malignant neoplasm of prostate: Secondary | ICD-10-CM | POA: Diagnosis not present

## 2018-03-21 DIAGNOSIS — M109 Gout, unspecified: Secondary | ICD-10-CM | POA: Diagnosis not present

## 2018-03-21 DIAGNOSIS — R7301 Impaired fasting glucose: Secondary | ICD-10-CM | POA: Diagnosis not present

## 2018-03-21 DIAGNOSIS — N529 Male erectile dysfunction, unspecified: Secondary | ICD-10-CM | POA: Diagnosis not present

## 2018-03-21 DIAGNOSIS — Z6827 Body mass index (BMI) 27.0-27.9, adult: Secondary | ICD-10-CM | POA: Diagnosis not present

## 2018-03-21 DIAGNOSIS — E781 Pure hyperglyceridemia: Secondary | ICD-10-CM | POA: Diagnosis not present

## 2018-03-21 DIAGNOSIS — I10 Essential (primary) hypertension: Secondary | ICD-10-CM | POA: Diagnosis not present

## 2018-03-26 DIAGNOSIS — Z1212 Encounter for screening for malignant neoplasm of rectum: Secondary | ICD-10-CM | POA: Diagnosis not present

## 2018-03-30 ENCOUNTER — Other Ambulatory Visit: Payer: Self-pay | Admitting: Cardiology

## 2018-03-30 MED ORDER — CARVEDILOL 25 MG PO TABS: 25.0000 mg | ORAL_TABLET | Freq: Two times a day (BID) | ORAL | 3 refills | Status: DC

## 2018-03-30 NOTE — Telephone Encounter (Signed)
Rx(s) sent to pharmacy electronically.  

## 2018-03-30 NOTE — Telephone Encounter (Signed)
°*  STAT* If patient is at the pharmacy, call can be transferred to refill team.   1. Which medications need to be refilled? (please list name of each medication and dose if known) Carvedilol  2. Which pharmacy/location (including street and city if local pharmacy) is medication to be sent to?[facility]  3. Do they need a 30 day or 90 day supply? 180 and refills

## 2018-04-17 DIAGNOSIS — H31092 Other chorioretinal scars, left eye: Secondary | ICD-10-CM | POA: Diagnosis not present

## 2018-04-17 DIAGNOSIS — H11011 Amyloid pterygium of right eye: Secondary | ICD-10-CM | POA: Diagnosis not present

## 2018-04-17 DIAGNOSIS — H04123 Dry eye syndrome of bilateral lacrimal glands: Secondary | ICD-10-CM | POA: Diagnosis not present

## 2018-04-17 DIAGNOSIS — H2513 Age-related nuclear cataract, bilateral: Secondary | ICD-10-CM | POA: Diagnosis not present

## 2018-06-09 IMAGING — CR DG RIBS W/ CHEST 3+V*L*
3 series · 3 of 3 positions shown · non-contrast
Comparison: None.

CLINICAL DATA: Fall, left rib pain

EXAM:
LEFT RIBS AND CHEST - 3+ VIEW

[w chest pa]
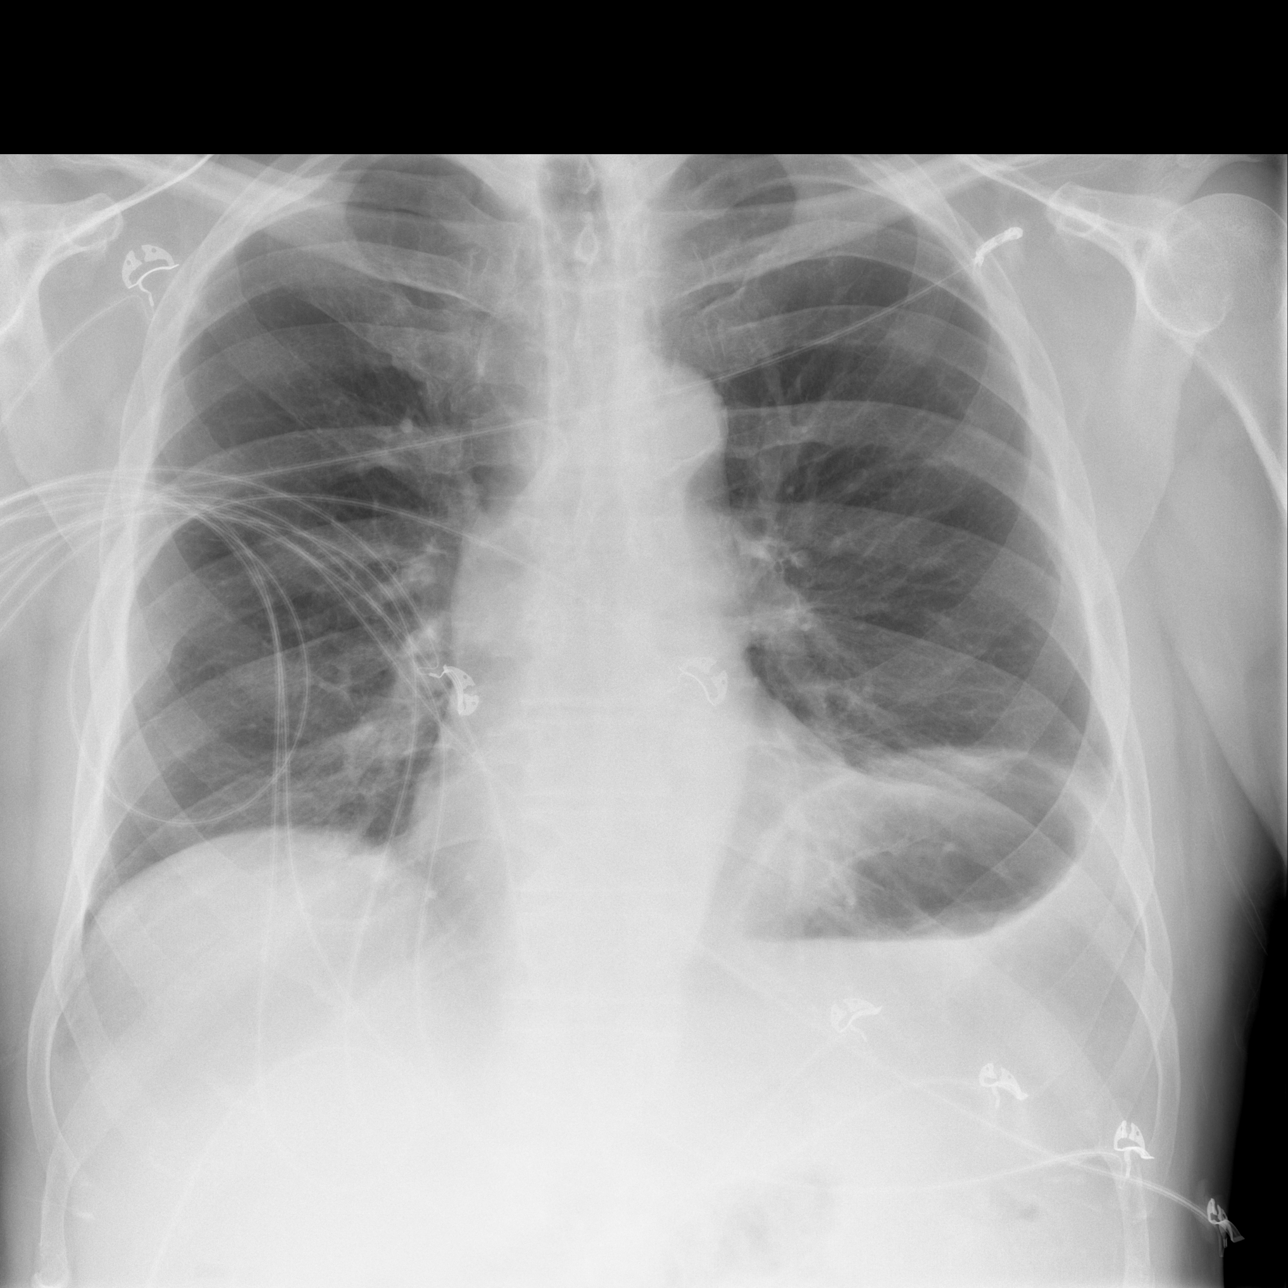

[w ribs ap/pa upper left]
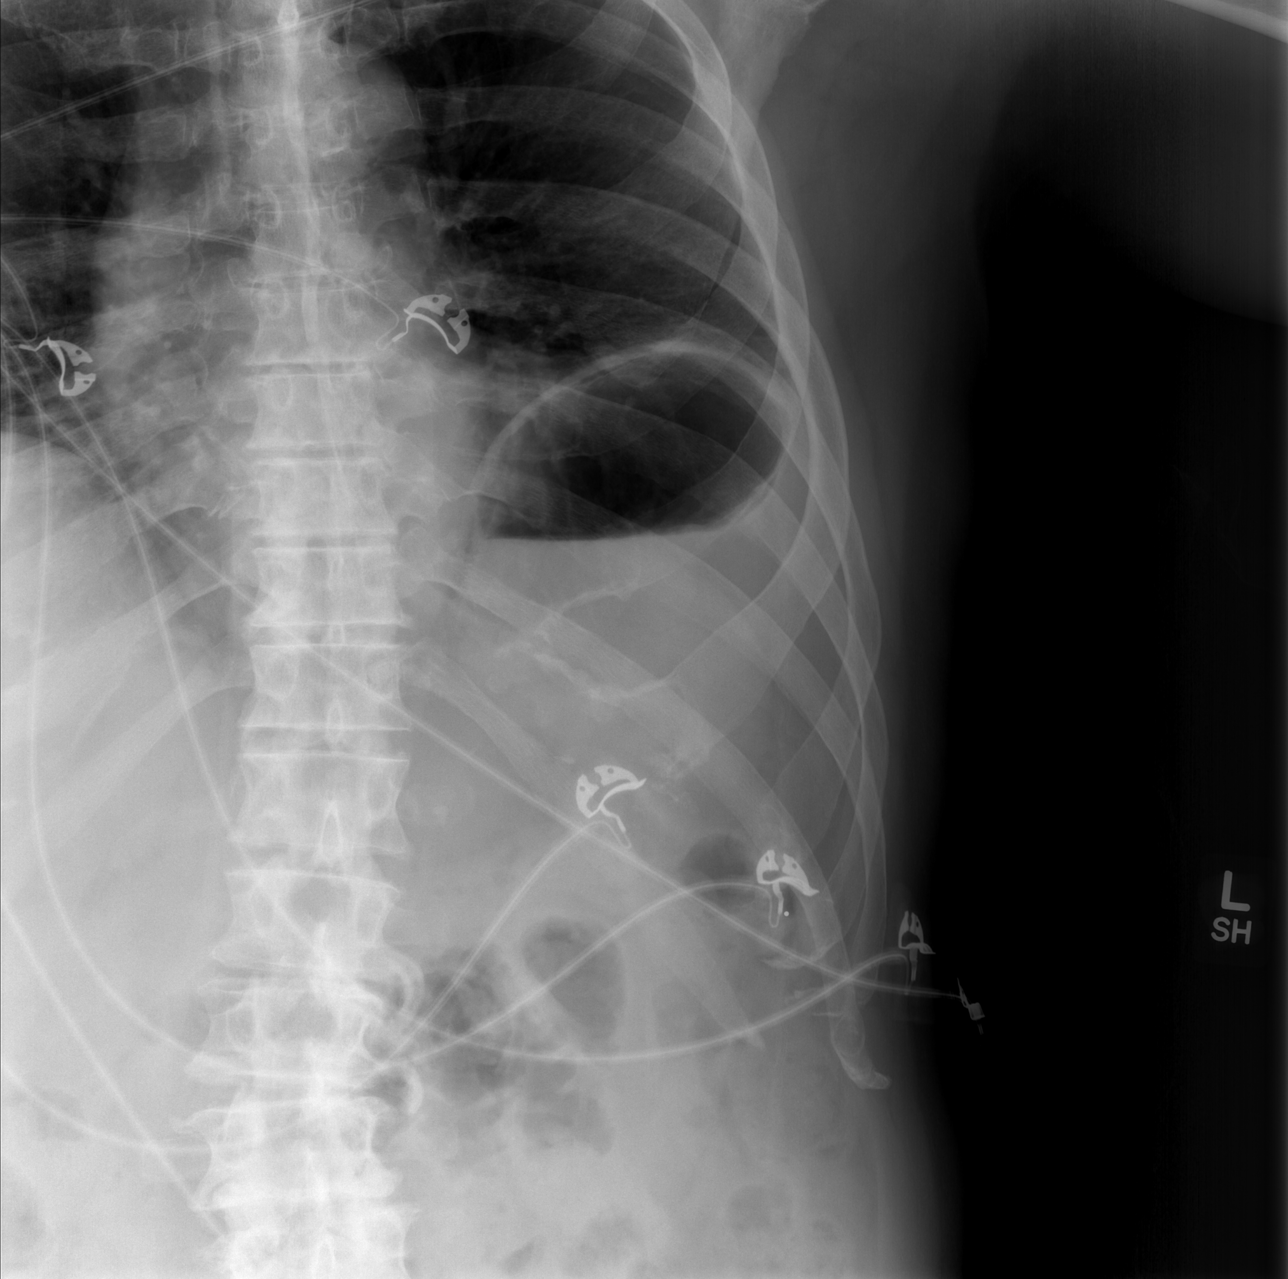

[w ribs oblique left]
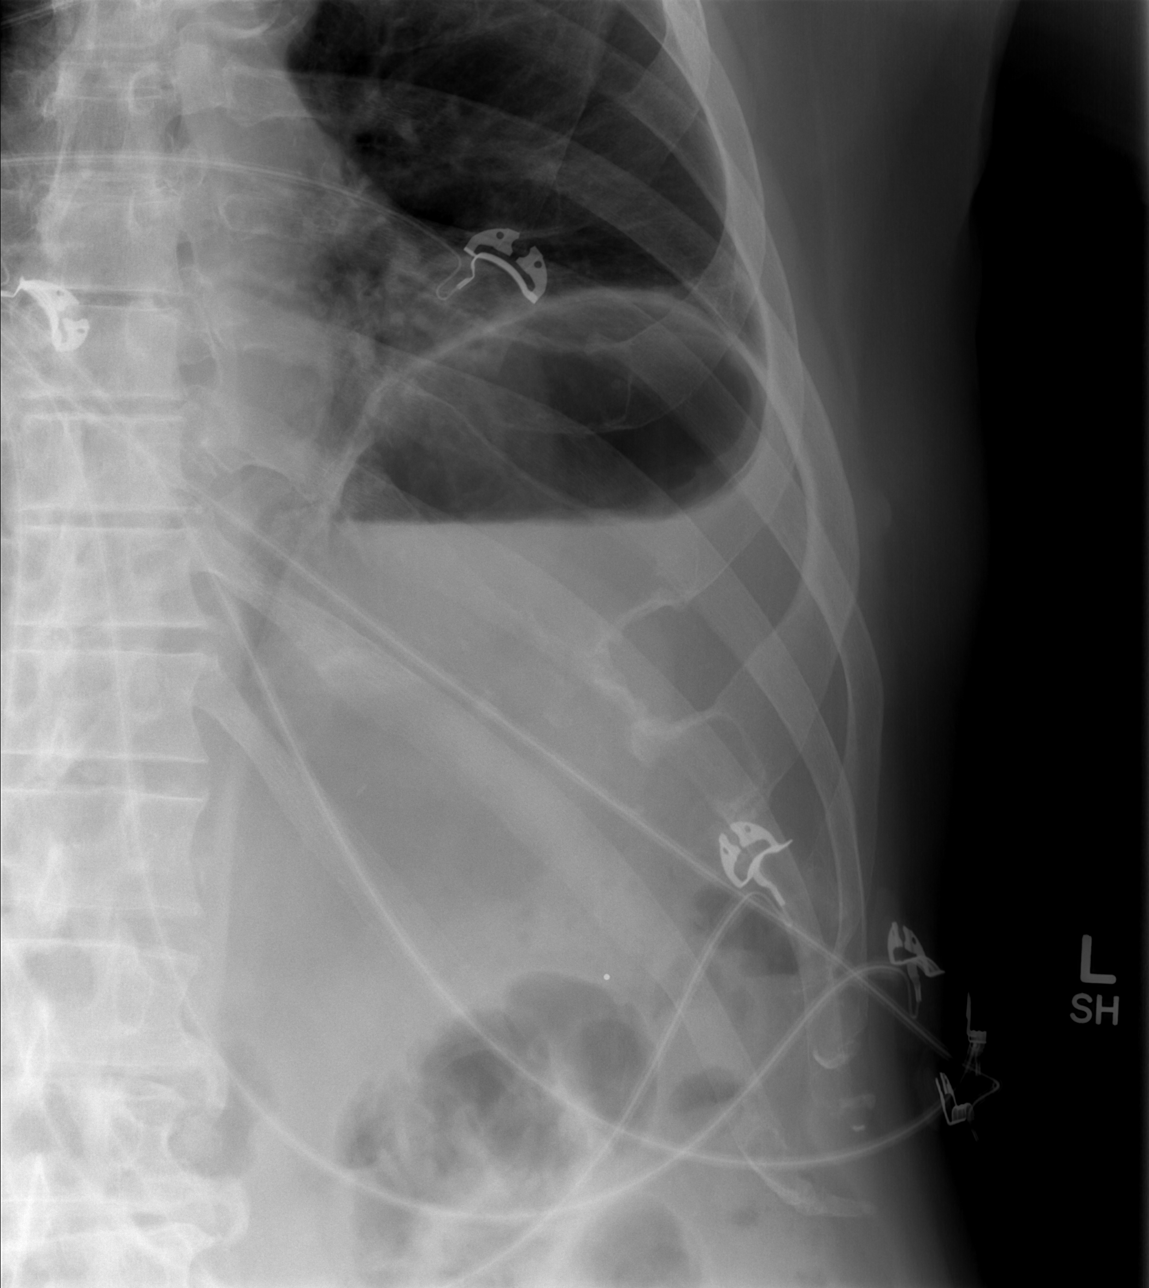

[3 of 3 positions shown; findings below may reference images not displayed]

FINDINGS: Bibasilar atelectasis. Heart is normal size. No effusions or
pneumothorax. No visible left rib fracture.
IMPRESSION: Bibasilar atelectasis.

No visible rib fracture.

## 2018-06-19 DIAGNOSIS — Z23 Encounter for immunization: Secondary | ICD-10-CM | POA: Diagnosis not present

## 2018-07-04 DIAGNOSIS — C4441 Basal cell carcinoma of skin of scalp and neck: Secondary | ICD-10-CM | POA: Diagnosis not present

## 2018-07-04 DIAGNOSIS — D1801 Hemangioma of skin and subcutaneous tissue: Secondary | ICD-10-CM | POA: Diagnosis not present

## 2018-07-04 DIAGNOSIS — L821 Other seborrheic keratosis: Secondary | ICD-10-CM | POA: Diagnosis not present

## 2018-07-04 DIAGNOSIS — Z85828 Personal history of other malignant neoplasm of skin: Secondary | ICD-10-CM | POA: Diagnosis not present

## 2018-07-04 DIAGNOSIS — L570 Actinic keratosis: Secondary | ICD-10-CM | POA: Diagnosis not present

## 2018-07-04 DIAGNOSIS — L814 Other melanin hyperpigmentation: Secondary | ICD-10-CM | POA: Diagnosis not present

## 2018-07-05 DIAGNOSIS — R972 Elevated prostate specific antigen [PSA]: Secondary | ICD-10-CM | POA: Diagnosis not present

## 2018-07-05 DIAGNOSIS — Z125 Encounter for screening for malignant neoplasm of prostate: Secondary | ICD-10-CM | POA: Diagnosis not present

## 2018-09-21 DIAGNOSIS — F419 Anxiety disorder, unspecified: Secondary | ICD-10-CM | POA: Diagnosis not present

## 2018-09-21 DIAGNOSIS — R972 Elevated prostate specific antigen [PSA]: Secondary | ICD-10-CM | POA: Diagnosis not present

## 2018-09-21 DIAGNOSIS — E781 Pure hyperglyceridemia: Secondary | ICD-10-CM | POA: Diagnosis not present

## 2018-09-21 DIAGNOSIS — I10 Essential (primary) hypertension: Secondary | ICD-10-CM | POA: Diagnosis not present

## 2018-09-21 DIAGNOSIS — R7301 Impaired fasting glucose: Secondary | ICD-10-CM | POA: Diagnosis not present

## 2018-09-21 DIAGNOSIS — Z6828 Body mass index (BMI) 28.0-28.9, adult: Secondary | ICD-10-CM | POA: Diagnosis not present

## 2018-11-26 DIAGNOSIS — J209 Acute bronchitis, unspecified: Secondary | ICD-10-CM | POA: Diagnosis not present

## 2018-11-26 DIAGNOSIS — J309 Allergic rhinitis, unspecified: Secondary | ICD-10-CM | POA: Diagnosis not present

## 2018-11-30 ENCOUNTER — Emergency Department (HOSPITAL_COMMUNITY)
Admission: EM | Admit: 2018-11-30 | Discharge: 2018-11-30 | Disposition: A | Discharge status: Home / Self Care | Payer: Medicare Other | Attending: Emergency Medicine | Admitting: Emergency Medicine

## 2018-11-30 ENCOUNTER — Other Ambulatory Visit: Payer: Self-pay

## 2018-11-30 ENCOUNTER — Encounter (HOSPITAL_COMMUNITY): Payer: Self-pay | Admitting: *Deleted

## 2018-11-30 DIAGNOSIS — Z7982 Long term (current) use of aspirin: Secondary | ICD-10-CM | POA: Diagnosis not present

## 2018-11-30 DIAGNOSIS — Z79899 Other long term (current) drug therapy: Secondary | ICD-10-CM | POA: Diagnosis not present

## 2018-11-30 DIAGNOSIS — J209 Acute bronchitis, unspecified: Secondary | ICD-10-CM | POA: Diagnosis not present

## 2018-11-30 DIAGNOSIS — K4090 Unilateral inguinal hernia, without obstruction or gangrene, not specified as recurrent: Secondary | ICD-10-CM | POA: Insufficient documentation

## 2018-11-30 DIAGNOSIS — Z87891 Personal history of nicotine dependence: Secondary | ICD-10-CM | POA: Insufficient documentation

## 2018-11-30 DIAGNOSIS — I10 Essential (primary) hypertension: Secondary | ICD-10-CM | POA: Insufficient documentation

## 2018-11-30 LAB — CBC
HCT: 46.7 % (ref 39.0–52.0)
Hemoglobin: 15.1 g/dL (ref 13.0–17.0)
MCH: 29 pg (ref 26.0–34.0)
MCHC: 32.3 g/dL (ref 30.0–36.0)
MCV: 89.6 fL (ref 80.0–100.0)
NRBC: 0 % (ref 0.0–0.2)
PLATELETS: 135 10*3/uL — AB (ref 150–400)
RBC: 5.21 MIL/uL (ref 4.22–5.81)
RDW: 13.8 % (ref 11.5–15.5)
WBC: 5.6 10*3/uL (ref 4.0–10.5)

## 2018-11-30 LAB — COMPREHENSIVE METABOLIC PANEL
ALK PHOS: 42 U/L (ref 38–126)
ALT: 46 U/L — AB (ref 0–44)
AST: 38 U/L (ref 15–41)
Albumin: 4.2 g/dL (ref 3.5–5.0)
Anion gap: 9 (ref 5–15)
BUN: 18 mg/dL (ref 8–23)
CALCIUM: 9.7 mg/dL (ref 8.9–10.3)
CO2: 24 mmol/L (ref 22–32)
CREATININE: 0.84 mg/dL (ref 0.61–1.24)
Chloride: 105 mmol/L (ref 98–111)
GFR calc non Af Amer: >60 mL/min (ref >60)
Glucose, Bld: 108 mg/dL — ABNORMAL HIGH (ref 70–99)
Potassium: 4.3 mmol/L (ref 3.5–5.1)
Sodium: 138 mmol/L (ref 135–145)
Total Bilirubin: 0.6 mg/dL (ref 0.3–1.2)
Total Protein: 7 g/dL (ref 6.5–8.1)

## 2018-11-30 MED ORDER — SODIUM CHLORIDE 0.9% FLUSH: 3.0000 mL | Freq: Once | INTRAVENOUS | Status: DC

## 2018-11-30 NOTE — ED Triage Notes (Signed)
Pt reports L inguinal hernia which he noticed yesterday.  He went to see Dr. Radene Gunning today and was instructed to come to the ED for evaluation.  He states Dr Radene Gunning could not reduce the hernia.

## 2018-11-30 NOTE — ED Provider Notes (Signed)
[facility] DEPT Provider Note   CSN: [id number] Arrival date & time: 11/30/18  1329    History   Chief Complaint Chief Complaint  Patient presents with  . Hernia    HPI Evan Mitchell is a 73 y.o. male.     HPI Patient presents with left-sided inguinal hernia.  Popped out last night.  Has not had a hernia previously but states he had a cyst on the spermatic cord on the side that had to be removed years ago.  Went to see Dr. Radene Gunning, his primary care doctor today and his doctor cannot reduce the hernia.  No nausea or vomiting.  Still passing gas.  No fevers.  States he had pain in the left lower abdomen yesterday but is improved somewhat today.  Not on anticoagulation. Past Medical History:  Diagnosis Date  . Allergic rhinitis   . Anxiety   . Hemorrhoids   . HTN (hypertension)   . Hypertriglyceridemia    Mild  . Impaired fasting glucose   . LBBB (left bundle branch block) 12/12/2012    Patient Active Problem List   Diagnosis Date Noted  . LBBB (left bundle branch block) 12/12/2012  . Hypertension, uncontrolled 01/10/2012  . Pre-diabetes 01/10/2012  . Hyperlipidemia 01/10/2012    Past Surgical History:  Procedure Laterality Date  . COLONOSCOPY    . HERNIA REPAIR  1995  . TONSILLECTOMY  1947        Home Medications    Prior to Admission medications   Medication Sig Start Date End Date Taking? Authorizing Provider  Acetaminophen (TYLENOL 8 HOUR PO) Take 2 tablets by mouth at bedtime as needed (pain).   Yes [provider]  allopurinol (ZYLOPRIM) 300 MG tablet Take 1 tablet by mouth daily. 12/19/17  Yes [provider]  aspirin EC 81 MG tablet Take 81 mg by mouth daily.   Yes [provider]  Calcium Carbonate-Vitamin D (CALCIUM + D PO) Take 1,200 mg by mouth daily.   Yes [provider]  carvedilol (COREG) 25 MG tablet Take 1 tablet (25 mg total) by mouth 2 (two) times daily with a meal. 03/30/18  Yes  Martinique, Peter M, MD  cefdinir (OMNICEF) 300 MG capsule Take 300 mg by mouth 2 (two) times daily. 11/26/18  Yes [provider]  GLUCOSAMINE-CHONDROITIN PO Take by mouth 3 (three) times daily.   Yes [provider]  hydrochlorothiazide (HYDRODIURIL) 25 MG tablet TAKE 1 TABLET (25 MG TOTAL) BY MOUTH DAILY. 02/24/16  Yes Martinique, Peter M, MD  lisinopril (PRINIVIL,ZESTRIL) 40 MG tablet Take 40 mg by mouth daily.   Yes [provider]  Melatonin 3 MG TABS Take 6 mg by mouth at bedtime.   Yes [provider]  Multiple Vitamins-Minerals (PRESERVISION AREDS PO) Take by mouth 2 (two) times daily.   Yes [provider]  Omega-3 Fatty Acids (FISH OIL PO) Take 1 tablet by mouth 3 (three) times daily.   Yes [provider]  simvastatin (ZOCOR) 40 MG tablet Take 40 mg by mouth every evening.   Yes [provider]  vitamin C (ASCORBIC ACID) 500 MG tablet Take 500 mg by mouth daily.   Yes [provider]  ondansetron (ZOFRAN ODT) 4 MG disintegrating tablet Take 1 tablet (4 mg total) by mouth every 8 (eight) hours as needed for nausea or vomiting. Patient not taking: Reported on 11/30/2018 11/09/17   Gareth Morgan, MD    Family History Family History  Problem  Relation Age of Onset  . Heart attack Mother   . Skin cancer Mother   . Diabetes Mother   . Hypertension Mother     Social History Social History   Tobacco Use  . Smoking status: Former Smoker    Last attempt to quit: 09/19/1976    Years since quitting: 42.2  . Smokeless tobacco: Never Used  Substance Use Topics  . Alcohol use: No    Alcohol/week: 0.0 standard drinks  . Drug use: No     Allergies   Codeine   Review of Systems Review of Systems  Constitutional: Negative for appetite change.  HENT: Negative for congestion.   Respiratory: Positive for cough. Negative for shortness of breath.   Gastrointestinal: Positive for abdominal pain. Negative for anal bleeding,  nausea and vomiting.  Genitourinary: Negative for discharge and flank pain.  Musculoskeletal: Negative for back pain.  Skin: Negative for rash.  Neurological: Negative for weakness.  Psychiatric/Behavioral: Negative for confusion.     Physical Exam Updated Vital Signs BP (!) 163/91 (BP Location: Right Arm)   Pulse 71   Temp 98.2 F (36.8 C) (Oral)   Resp 16   SpO2 98%   Physical Exam Vitals signs and nursing note reviewed.  HENT:     Head: Atraumatic.     Mouth/Throat:     Mouth: Mucous membranes are moist.  Neck:     Musculoskeletal: Neck supple.  Cardiovascular:     Rate and Rhythm: Regular rhythm.  Chest:     Chest wall: No tenderness.  Abdominal:     Comments: Left-sided inguinal hernia.  Some tenderness.  No abdominal distention.  No abdominal tenderness besides the mass.  Genitourinary:    Penis: Normal.      Scrotum/Testes: Normal.  Musculoskeletal:        General: No signs of injury.  Skin:    General: Skin is warm.     Capillary Refill: Capillary refill takes less than 2 seconds.  Neurological:     Mental Status: He is alert and oriented to person, place, and time.      ED Treatments / Results  Labs (all labs ordered are listed, but only abnormal results are displayed) Labs Reviewed  COMPREHENSIVE METABOLIC PANEL - Abnormal; Notable for the following components:      Result Value   Glucose, Bld 108 (*)    ALT 46 (*)    All other components within normal limits  CBC - Abnormal; Notable for the following components:   Platelets 135 (*)    All other components within normal limits  URINALYSIS, ROUTINE W REFLEX MICROSCOPIC    EKG None  Radiology No results found.  Procedures Procedures (including critical care time)  Medications Ordered in ED Medications  sodium chloride flush (NS) 0.9 % injection 3 mL (has no administration in time range)     Initial Impression / Assessment and Plan / ED Course  I have reviewed the triage vital signs  and the nursing notes.  Pertinent labs & imaging results that were available during my care of the patient were reviewed by me and considered in my medical decision making (see chart for details).        Patient with left-sided inguinal hernia.  Reduced in the ER.  Later stood up and no recurrence of the hernia.  Discussed follow-up precautions and instructed on how to potentially reduce this at home.  We will follow-up with general surgery.  Discharge home.  Final Clinical Impressions(s) /  ED Diagnoses   Final diagnoses:  Left inguinal hernia    ED Discharge Orders    None       Davonna Belling, MD 11/30/18 [phone]

## 2018-11-30 NOTE — ED Notes (Signed)
ED Provider at bedside. 

## 2018-11-30 NOTE — ED Triage Notes (Signed)
Laid pt back on the stretcher-chair and placed ice pack on his L groin per [facility] EDPA

## 2018-12-13 ENCOUNTER — Other Ambulatory Visit: Payer: Self-pay | Admitting: Surgery

## 2018-12-13 DIAGNOSIS — K4090 Unilateral inguinal hernia, without obstruction or gangrene, not specified as recurrent: Secondary | ICD-10-CM | POA: Diagnosis not present

## 2019-02-18 ENCOUNTER — Telehealth: Payer: Self-pay | Admitting: Cardiology

## 2019-02-18 NOTE — Telephone Encounter (Signed)
LVM for pt regarding cancelled appt on 02-25-19.  Will put him on cancellation list with Dr. Martinique.

## 2019-02-22 ENCOUNTER — Encounter (HOSPITAL_BASED_OUTPATIENT_CLINIC_OR_DEPARTMENT_OTHER): Payer: Self-pay | Admitting: *Deleted

## 2019-02-22 ENCOUNTER — Other Ambulatory Visit: Payer: Self-pay

## 2019-02-22 NOTE — Progress Notes (Signed)
Spoke with Evan Mitchell and Evan Mitchell by phone Npo after midnight, arrive 1030 am 02-28-19 [facility] meds to take sip of water: allopurinol, carvedilol Has surgery orders in epic Needs I stat 8 and ekg lov dr [id number] on chart/epic Driver wife Evan cell [phone]  covid test scheduled for 02-25-19 at 320pm

## 2019-02-25 ENCOUNTER — Other Ambulatory Visit (HOSPITAL_COMMUNITY)
Admission: RE | Admit: 2019-02-25 | Discharge: 2019-02-25 | Disposition: A | Discharge status: Home / Self Care | Payer: Medicare Other | Source: Ambulatory Visit | Attending: Surgery | Admitting: Surgery

## 2019-02-25 ENCOUNTER — Other Ambulatory Visit: Payer: Self-pay

## 2019-02-25 ENCOUNTER — Ambulatory Visit: Payer: Medicare Other | Admitting: Cardiology

## 2019-02-25 DIAGNOSIS — Z1159 Encounter for screening for other viral diseases: Secondary | ICD-10-CM | POA: Diagnosis not present

## 2019-02-27 LAB — NOVEL CORONAVIRUS, NAA (HOSP ORDER, SEND-OUT TO REF LAB; TAT 18-24 HRS): SARS-CoV-2, NAA: NOT DETECTED

## 2019-02-27 NOTE — H&P (Signed)
Evan Mitchell  Location: [facility] Patient #: [id number] DOB: 1945-12-18 Married / Language: English / Race: White Male   History of Present Illness  The patient is a 73 year old male who presents with an inguinal hernia. This is a patient of Dr. Danna Hefty who presents with left inguinal hernia. He has had discomfort from time to time in his left groin. On March 13, the hernia became incarcerated. He could not be reduced by primary care physician so he was sent to the emergency department where it was finally reduced. Since then, he has done well and has been wearing a truss and has no complaints. He has no groin pain today. He had no obstructive symptoms.   Past Surgical History Tonsillectomy   Diagnostic Studies History  Colonoscopy  never  Allergies ( CODEINE   Medication History  Allopurinol (300MG  Tablet, Oral) Active. Carvedilol (25MG  Tablet, Oral) Active. hydroCHLOROthiazide (25MG  Tablet, Oral) Active. Lisinopril (40MG  Tablet, Oral) Active. Simvastatin (40MG  Tablet, Oral) Active. Vitamin C (500MG  Capsule, Oral) Active. Glucosamine Chondroitin (Oral) Active. Medications Reconciled  Social History  Alcohol use  Occasional alcohol use. Caffeine use  Carbonated beverages, Coffee, Tea. No drug use  Tobacco use  Former smoker.  Family History  Alcohol Abuse  Brother, Sister. Arthritis  Mother. Depression  Mother. Diabetes Mellitus  Brother, Sister. Heart Disease  Mother. Heart disease in male family member before age 1  Hypertension  Mother.  Other Problems Andreas Blower, CMA; 12/13/2018 9:31 AM) Anxiety Disorder  Arthritis  Enlarged Prostate  High blood pressure  Hypercholesterolemia  Inguinal Hernia     Review of Systems  General Not Present- Appetite Loss, Chills, Fatigue, Fever, Night Sweats, Weight Gain and Weight Loss. Skin Not Present- Change in Wart/Mole, Dryness, Hives, Jaundice, New Lesions,  Non-Healing Wounds, Rash and Ulcer. HEENT Present- Seasonal Allergies and Wears glasses/contact lenses. Not Present- Earache, Hearing Loss, Hoarseness, Nose Bleed, Oral Ulcers, Ringing in the Ears, Sinus Pain, Sore Throat, Visual Disturbances and Yellow Eyes. Breast Not Present- Breast Mass, Breast Pain, Nipple Discharge and Skin Changes. Cardiovascular Not Present- Chest Pain, Difficulty Breathing Lying Down, Leg Cramps, Palpitations, Rapid Heart Rate, Shortness of Breath and Swelling of Extremities. Gastrointestinal Not Present- Abdominal Pain, Bloating, Bloody Stool, Change in Bowel Habits, Chronic diarrhea, Constipation, Difficulty Swallowing, Excessive gas, Gets full quickly at meals, Hemorrhoids, Indigestion, Nausea, Rectal Pain and Vomiting. Male Genitourinary Present- Impotence. Not Present- Blood in Urine, Change in Urinary Stream, Frequency, Nocturia, Painful Urination, Urgency and Urine Leakage.  Vitals  Weight: 207 lb Height: 72in Body Surface Area: 2.16 m Body Mass Index: 28.07 kg/m  Temp.: 97.6F  Pulse: 79 (Regular)  P.OX: 95% (Room air) BP: 164/82 (Sitting, Left Arm, Standard)   Physical Exam General Mental Status-Alert. General Appearance-Consistent with stated age. Hydration-Well hydrated. Voice-Normal.  Head and Neck Head-normocephalic, atraumatic with no lesions or palpable masses. Trachea-midline.  Eye Eyeball - Bilateral-Extraocular movements intact. Sclera/Conjunctiva - Bilateral-No scleral icterus.  Chest and Lung Exam Chest and lung exam reveals -quiet, even and easy respiratory effort with no use of accessory muscles and on auscultation, normal breath sounds, no adventitious sounds and normal vocal resonance. Inspection Chest Wall - Normal. Back - normal.  Cardiovascular Cardiovascular examination reveals -normal heart sounds, regular rate and rhythm with no murmurs and normal pedal pulses  bilaterally.  Abdomen Inspection Skin - Scar - no surgical scars. Hernias - Inguinal hernia - Left - Reducible. Note: The hernia is easily reducible and nontender. Palpation/Percussion Palpation and Percussion of  the abdomen reveal - Soft, Non Tender, No Rebound tenderness, No Rigidity (guarding) and No hepatosplenomegaly. Auscultation Auscultation of the abdomen reveals - Bowel sounds normal.  Neurologic Neurologic evaluation reveals -alert and oriented x 3 with no impairment of recent or remote memory. Mental Status-Normal.  Musculoskeletal Normal Exam - Left-Upper Extremity Strength Normal and Lower Extremity Strength Normal. Normal Exam - Right-Upper Extremity Strength Normal, Lower Extremity Weakness.    Assessment & Plan  LEFT INGUINAL HERNIA (K40.90)  Impression: I discussed the diagnosis of inguinal hernia with the patient in detail. We discussed left inguinal hernia repair with mesh. We then discussed whether the surgery would be emergent, urgency, or elective especially with the outbreak of the corona virus. At this point, we have decided to hold on surgery as he is now asymptomatic. Should he develop any discomfort, we would then have to change this to an urgent surgery. I discussed the signs and symptoms of incarceration. I discussed the surgical procedure in detail. We discussed risks of bleeding, infection, injury to surrounding structures, mesh use, cardiopulmonary issues, postoperative recovery, etc. He understands and agrees with our plan

## 2019-02-27 NOTE — Progress Notes (Signed)
SPOKE W/  _Patient     SCREENING SYMPTOMS OF COVID 19:   COUGH--no  RUNNY NOSE--- no  SORE THROAT---no  NASAL CONGESTION----no  SNEEZING----no  SHORTNESS OF BREATH---no  DIFFICULTY BREATHING---no  TEMP >100.0 -----no  UNEXPLAINED BODY ACHES------no  CHILLS -------- no  HEADACHES ---------no  LOSS OF SMELL/ TASTE --------no    HAVE YOU OR ANY FAMILY MEMBER TRAVELLED PAST 14 DAYS OUT OF THE   COUNTY---no STATE----no COUNTRY----no  HAVE YOU OR ANY FAMILY MEMBER BEEN EXPOSED TO ANYONE WITH COVID 19? no    

## 2019-02-28 ENCOUNTER — Encounter (HOSPITAL_BASED_OUTPATIENT_CLINIC_OR_DEPARTMENT_OTHER)
Admission: RE | Disposition: A | Discharge status: Home / Self Care | Payer: Self-pay | Source: Home / Self Care | Attending: Surgery

## 2019-02-28 ENCOUNTER — Other Ambulatory Visit: Payer: Self-pay

## 2019-02-28 ENCOUNTER — Ambulatory Visit (HOSPITAL_COMMUNITY)
Admission: RE | Admit: 2019-02-28 | Discharge: 2019-02-28 | Disposition: A | Discharge status: Home / Self Care | Payer: Medicare Other | Attending: Surgery | Admitting: Surgery

## 2019-02-28 ENCOUNTER — Ambulatory Visit (HOSPITAL_BASED_OUTPATIENT_CLINIC_OR_DEPARTMENT_OTHER): Payer: Medicare Other | Admitting: Anesthesiology

## 2019-02-28 ENCOUNTER — Encounter (HOSPITAL_BASED_OUTPATIENT_CLINIC_OR_DEPARTMENT_OTHER): Payer: Self-pay

## 2019-02-28 DIAGNOSIS — Z818 Family history of other mental and behavioral disorders: Secondary | ICD-10-CM | POA: Insufficient documentation

## 2019-02-28 DIAGNOSIS — M1990 Unspecified osteoarthritis, unspecified site: Secondary | ICD-10-CM | POA: Diagnosis not present

## 2019-02-28 DIAGNOSIS — Z833 Family history of diabetes mellitus: Secondary | ICD-10-CM | POA: Insufficient documentation

## 2019-02-28 DIAGNOSIS — Z8249 Family history of ischemic heart disease and other diseases of the circulatory system: Secondary | ICD-10-CM | POA: Diagnosis not present

## 2019-02-28 DIAGNOSIS — Z885 Allergy status to narcotic agent status: Secondary | ICD-10-CM | POA: Diagnosis not present

## 2019-02-28 DIAGNOSIS — E7800 Pure hypercholesterolemia, unspecified: Secondary | ICD-10-CM | POA: Diagnosis not present

## 2019-02-28 DIAGNOSIS — K4091 Unilateral inguinal hernia, without obstruction or gangrene, recurrent: Secondary | ICD-10-CM | POA: Diagnosis not present

## 2019-02-28 DIAGNOSIS — F419 Anxiety disorder, unspecified: Secondary | ICD-10-CM | POA: Diagnosis not present

## 2019-02-28 DIAGNOSIS — E785 Hyperlipidemia, unspecified: Secondary | ICD-10-CM | POA: Diagnosis not present

## 2019-02-28 DIAGNOSIS — Z87891 Personal history of nicotine dependence: Secondary | ICD-10-CM | POA: Insufficient documentation

## 2019-02-28 DIAGNOSIS — N400 Enlarged prostate without lower urinary tract symptoms: Secondary | ICD-10-CM | POA: Insufficient documentation

## 2019-02-28 DIAGNOSIS — Z811 Family history of alcohol abuse and dependence: Secondary | ICD-10-CM | POA: Diagnosis not present

## 2019-02-28 DIAGNOSIS — I10 Essential (primary) hypertension: Secondary | ICD-10-CM | POA: Insufficient documentation

## 2019-02-28 DIAGNOSIS — Z8261 Family history of arthritis: Secondary | ICD-10-CM | POA: Insufficient documentation

## 2019-02-28 DIAGNOSIS — Z79899 Other long term (current) drug therapy: Secondary | ICD-10-CM | POA: Diagnosis not present

## 2019-02-28 DIAGNOSIS — I447 Left bundle-branch block, unspecified: Secondary | ICD-10-CM | POA: Diagnosis not present

## 2019-02-28 HISTORY — DX: Unspecified osteoarthritis, unspecified site: M19.90

## 2019-02-28 HISTORY — PX: INGUINAL HERNIA REPAIR: SHX194

## 2019-02-28 LAB — POCT I-STAT, CHEM 8
BUN: 19 mg/dL (ref 8–23)
Calcium, Ion: 1.21 mmol/L (ref 1.15–1.40)
Chloride: 104 mmol/L (ref 98–111)
Creatinine, Ser: 0.8 mg/dL (ref 0.61–1.24)
Glucose, Bld: 118 mg/dL — ABNORMAL HIGH (ref 70–99)
HCT: 51 % (ref 39.0–52.0)
Hemoglobin: 17.3 g/dL — ABNORMAL HIGH (ref 13.0–17.0)
Potassium: 4.4 mmol/L (ref 3.5–5.1)
Sodium: 140 mmol/L (ref 135–145)
TCO2: 26 mmol/L (ref 22–32)

## 2019-02-28 SURGERY — REPAIR, HERNIA, INGUINAL, ADULT
Anesthesia: General | Site: Groin | Laterality: Left

## 2019-02-28 MED ORDER — FENTANYL CITRATE (PF) 100 MCG/2ML IJ SOLN
INTRAMUSCULAR | Status: AC
  Filled 2019-02-28: qty 2

## 2019-02-28 MED ORDER — ONDANSETRON HCL 4 MG/2ML IJ SOLN
INTRAMUSCULAR | Status: AC
  Filled 2019-02-28: qty 2

## 2019-02-28 MED ORDER — FENTANYL CITRATE (PF) 100 MCG/2ML IJ SOLN
100.0000 ug | Freq: Once | INTRAMUSCULAR | Status: AC
  Administered 2019-02-28: 100 ug via INTRAVENOUS
  Filled 2019-02-28: qty 2

## 2019-02-28 MED ORDER — CEFAZOLIN SODIUM-DEXTROSE 2-4 GM/100ML-% IV SOLN
2.0000 g | INTRAVENOUS | Status: AC
  Administered 2019-02-28: 2 g via INTRAVENOUS
  Filled 2019-02-28: qty 100

## 2019-02-28 MED ORDER — CHLORHEXIDINE GLUCONATE CLOTH 2 % EX PADS
6.0000 | MEDICATED_PAD | Freq: Once | CUTANEOUS | Status: DC
  Filled 2019-02-28: qty 6

## 2019-02-28 MED ORDER — TRAMADOL HCL 50 MG PO TABS: 50.0000 mg | ORAL_TABLET | Freq: Four times a day (QID) | ORAL | 0 refills | Status: DC | PRN

## 2019-02-28 MED ORDER — LIDOCAINE 2% (20 MG/ML) 5 ML SYRINGE
INTRAMUSCULAR | Status: AC
  Filled 2019-02-28: qty 5

## 2019-02-28 MED ORDER — PHENYLEPHRINE 40 MCG/ML (10ML) SYRINGE FOR IV PUSH (FOR BLOOD PRESSURE SUPPORT)
PREFILLED_SYRINGE | INTRAVENOUS | Status: DC | PRN
  Administered 2019-02-28 (×3): 80 ug via INTRAVENOUS

## 2019-02-28 MED ORDER — ONDANSETRON HCL 4 MG/2ML IJ SOLN
INTRAMUSCULAR | Status: DC | PRN
  Administered 2019-02-28: 4 mg via INTRAVENOUS

## 2019-02-28 MED ORDER — ACETAMINOPHEN 500 MG PO TABS
1000.0000 mg | ORAL_TABLET | ORAL | Status: AC
  Administered 2019-02-28: 1000 mg via ORAL
  Filled 2019-02-28: qty 2

## 2019-02-28 MED ORDER — DEXAMETHASONE SODIUM PHOSPHATE 10 MG/ML IJ SOLN
INTRAMUSCULAR | Status: DC | PRN
  Administered 2019-02-28: 4 mg via INTRAVENOUS

## 2019-02-28 MED ORDER — PROPOFOL 10 MG/ML IV BOLUS
INTRAVENOUS | Status: AC
  Filled 2019-02-28: qty 20

## 2019-02-28 MED ORDER — PROPOFOL 10 MG/ML IV BOLUS
INTRAVENOUS | Status: DC | PRN
  Administered 2019-02-28: 150 mg via INTRAVENOUS

## 2019-02-28 MED ORDER — CEFAZOLIN SODIUM-DEXTROSE 2-4 GM/100ML-% IV SOLN
INTRAVENOUS | Status: AC
  Filled 2019-02-28: qty 100

## 2019-02-28 MED ORDER — MIDAZOLAM HCL 2 MG/2ML IJ SOLN
INTRAMUSCULAR | Status: AC
  Filled 2019-02-28: qty 2

## 2019-02-28 MED ORDER — BUPIVACAINE LIPOSOME 1.3 % IJ SUSP
INTRAMUSCULAR | Status: DC | PRN
  Administered 2019-02-28: 10 mL

## 2019-02-28 MED ORDER — MIDAZOLAM HCL 2 MG/2ML IJ SOLN
2.0000 mg | Freq: Once | INTRAMUSCULAR | Status: AC
  Administered 2019-02-28: 2 mg via INTRAVENOUS
  Filled 2019-02-28: qty 2

## 2019-02-28 MED ORDER — GABAPENTIN 300 MG PO CAPS
300.0000 mg | ORAL_CAPSULE | ORAL | Status: AC
  Administered 2019-02-28: 300 mg via ORAL
  Filled 2019-02-28: qty 1

## 2019-02-28 MED ORDER — BUPIVACAINE-EPINEPHRINE (PF) 0.5% -1:200000 IJ SOLN
INTRAMUSCULAR | Status: DC | PRN
  Administered 2019-02-28: 30 mL

## 2019-02-28 MED ORDER — FENTANYL CITRATE (PF) 100 MCG/2ML IJ SOLN
INTRAMUSCULAR | Status: DC | PRN
  Administered 2019-02-28: 50 ug via INTRAVENOUS

## 2019-02-28 MED ORDER — DEXAMETHASONE SODIUM PHOSPHATE 10 MG/ML IJ SOLN
INTRAMUSCULAR | Status: AC
  Filled 2019-02-28: qty 1

## 2019-02-28 MED ORDER — SODIUM CHLORIDE 0.9 % IR SOLN
Status: DC | PRN
  Administered 2019-02-28: 500 mL

## 2019-02-28 MED ORDER — GABAPENTIN 300 MG PO CAPS
ORAL_CAPSULE | ORAL | Status: AC
  Filled 2019-02-28: qty 1

## 2019-02-28 MED ORDER — ACETAMINOPHEN 500 MG PO TABS
ORAL_TABLET | ORAL | Status: AC
  Filled 2019-02-28: qty 2

## 2019-02-28 MED ORDER — LIDOCAINE 2% (20 MG/ML) 5 ML SYRINGE
INTRAMUSCULAR | Status: DC | PRN
  Administered 2019-02-28: 20 mg via INTRAVENOUS
  Administered 2019-02-28: 80 mg via INTRAVENOUS

## 2019-02-28 MED ORDER — BUPIVACAINE-EPINEPHRINE 0.5% -1:200000 IJ SOLN
INTRAMUSCULAR | Status: DC | PRN
  Administered 2019-02-28: 20 mL

## 2019-02-28 MED ORDER — LACTATED RINGERS IV SOLN
INTRAVENOUS | Status: DC
  Administered 2019-02-28: 11:00:00 via INTRAVENOUS
  Filled 2019-02-28: qty 1000

## 2019-02-28 SURGICAL SUPPLY — 68 items
ADH SKN CLS APL DERMABOND .7 (GAUZE/BANDAGES/DRESSINGS) ×1
APL SKNCLS STERI-STRIP NONHPOA (GAUZE/BANDAGES/DRESSINGS)
BENZOIN TINCTURE PRP APPL 2/3 (GAUZE/BANDAGES/DRESSINGS) IMPLANT
BLADE CLIPPER SENSICLIP SURGIC (BLADE) IMPLANT
BLADE SURG 10 STRL SS (BLADE) ×3 IMPLANT
BLADE SURG 15 STRL LF DISP TIS (BLADE) IMPLANT
BLADE SURG 15 STRL SS (BLADE)
CANISTER SUCT 3000ML PPV (MISCELLANEOUS) IMPLANT
CLEANER CAUTERY TIP 5X5 PAD (MISCELLANEOUS) ×1 IMPLANT
CLOSURE WOUND 1/2 X4 (GAUZE/BANDAGES/DRESSINGS)
COVER BACK TABLE 60X90IN (DRAPES) ×3 IMPLANT
COVER MAYO STAND STRL (DRAPES) ×3 IMPLANT
COVER WAND RF STERILE (DRAPES) ×6 IMPLANT
DERMABOND ADVANCED (GAUZE/BANDAGES/DRESSINGS) ×2
DERMABOND ADVANCED .7 DNX12 (GAUZE/BANDAGES/DRESSINGS) ×1 IMPLANT
DRAIN PENROSE 18X1/2 LTX STRL (DRAIN) ×3 IMPLANT
DRAPE INCISE IOBAN 66X45 STRL (DRAPES) ×3 IMPLANT
DRAPE LAPAROTOMY 100X72 PEDS (DRAPES) ×3 IMPLANT
ELECT REM PT RETURN 9FT ADLT (ELECTROSURGICAL) ×3
ELECTRODE REM PT RTRN 9FT ADLT (ELECTROSURGICAL) ×1 IMPLANT
GAUZE SPONGE 4X4 12PLY STRL (GAUZE/BANDAGES/DRESSINGS) IMPLANT
GLOVE BIO SURGEON STRL SZ7.5 (GLOVE) ×2 IMPLANT
GLOVE BIOGEL PI IND STRL 7.5 (GLOVE) IMPLANT
GLOVE BIOGEL PI INDICATOR 7.5 (GLOVE) ×6
GLOVE INDICATOR 8.0 STRL GRN (GLOVE) ×3 IMPLANT
GLOVE OPTIFIT SS 7.0 STRL BRWN (GLOVE) ×1
GLOVE OPTIFIT SS 7.5 STRL LX (GLOVE) ×2 IMPLANT
GOWN STRL REUS W/ TWL LRG LVL3 (GOWN DISPOSABLE) ×1 IMPLANT
GOWN STRL REUS W/ TWL XL LVL3 (GOWN DISPOSABLE) ×1 IMPLANT
GOWN STRL REUS W/TWL LRG LVL3 (GOWN DISPOSABLE) ×3
GOWN STRL REUS W/TWL XL LVL3 (GOWN DISPOSABLE) ×3
KIT TURNOVER CYSTO (KITS) ×3 IMPLANT
MESH PARIETEX PROGRIP LEFT (Mesh General) ×2 IMPLANT
NDL HYPO 25X1 1.5 SAFETY (NEEDLE) ×1 IMPLANT
NEEDLE HYPO 22GX1.5 SAFETY (NEEDLE) ×3 IMPLANT
NEEDLE HYPO 25X1 1.5 SAFETY (NEEDLE) ×3 IMPLANT
NS IRRIG 500ML POUR BTL (IV SOLUTION) ×3 IMPLANT
PACK BASIN DAY SURGERY FS (CUSTOM PROCEDURE TRAY) ×3 IMPLANT
PAD CLEANER CAUTERY TIP 5X5 (MISCELLANEOUS) ×2
PENCIL BUTTON HOLSTER BLD 10FT (ELECTRODE) ×3 IMPLANT
SPONGE GAUZE 2X2 8PLY STER LF (GAUZE/BANDAGES/DRESSINGS)
SPONGE GAUZE 2X2 8PLY STRL LF (GAUZE/BANDAGES/DRESSINGS) IMPLANT
SPONGE LAP 4X18 RFD (DISPOSABLE) ×3 IMPLANT
STRIP CLOSURE SKIN 1/2X4 (GAUZE/BANDAGES/DRESSINGS) IMPLANT
SUT MNCRL AB 4-0 PS2 18 (SUTURE) ×5 IMPLANT
SUT PROLENE 2 0 CT2 30 (SUTURE) ×2 IMPLANT
SUT SILK 2 0 SH (SUTURE) IMPLANT
SUT SURG 0 T 19/GS 22 1969 62 (SUTURE) IMPLANT
SUT VIC AB 2-0 CT1 (SUTURE) ×2 IMPLANT
SUT VIC AB 2-0 CT1 27 (SUTURE) ×3
SUT VIC AB 2-0 CT1 TAPERPNT 27 (SUTURE) ×1 IMPLANT
SUT VIC AB 3-0 54X BRD REEL (SUTURE) IMPLANT
SUT VIC AB 3-0 BRD 54 (SUTURE)
SUT VIC AB 3-0 CT1 27 (SUTURE)
SUT VIC AB 3-0 CT1 27XBRD (SUTURE) ×1 IMPLANT
SUT VIC AB 3-0 CT1 36 (SUTURE) ×2 IMPLANT
SUT VIC AB 3-0 SH 27 (SUTURE)
SUT VIC AB 3-0 SH 27X BRD (SUTURE) IMPLANT
SUT VICRYL AB 3 0 TIES (SUTURE) ×3 IMPLANT
SYR BULB 3OZ (MISCELLANEOUS) ×1 IMPLANT
SYR BULB IRRIGATION 50ML (SYRINGE) ×2 IMPLANT
SYR CONTROL 10ML LL (SYRINGE) ×2 IMPLANT
TOWEL OR 17X26 10 PK STRL BLUE (TOWEL DISPOSABLE) ×6 IMPLANT
TRAY DSU PREP LF (CUSTOM PROCEDURE TRAY) ×3 IMPLANT
TUBE CONNECTING 12'X1/4 (SUCTIONS)
TUBE CONNECTING 12X1/4 (SUCTIONS) IMPLANT
WATER STERILE IRR 500ML POUR (IV SOLUTION) ×3 IMPLANT
YANKAUER SUCT BULB TIP NO VENT (SUCTIONS) IMPLANT

## 2019-02-28 NOTE — Discharge Instructions (Signed)
[facility]__[facility]  UMBILICAL OR INGUINAL HERNIA REPAIR: POST OP INSTRUCTIONS  Always review your discharge instruction sheet given to you by the facility where your surgery was performed. IF YOU HAVE DISABILITY OR FAMILY LEAVE FORMS, YOU MUST BRING THEM TO THE OFFICE FOR PROCESSING.   DO NOT GIVE THEM TO YOUR DOCTOR.  1. A  prescription for pain medication may be given to you upon discharge.  Take your pain medication as prescribed, if needed.  If narcotic pain medicine is not needed, then you may take acetaminophen (Tylenol) or ibuprofen (Advil) as needed. 2. Take your usually prescribed medications unless otherwise directed. If you need a refill on your pain medication, please contact your pharmacy.  They will contact our office to request authorization. Prescriptions will not be filled after 5 pm or on week-ends. 3. You should follow a light diet the first 24 hours after arrival home, such as soup and crackers, etc.  Be sure to include lots of fluids daily.  Resume your normal diet the day after surgery. 4.Most patients will experience some swelling and bruising around the umbilicus or in the groin and scrotum.  Ice packs and reclining will help.  Swelling and bruising can take several days to resolve.  6. It is common to experience some constipation if taking pain medication after surgery.  Increasing fluid intake and taking a stool softener (such as Colace) will usually help or prevent this problem from occurring.  A mild laxative (Milk of Magnesia or Miralax) should be taken according to package directions if there are no bowel movements after 48 hours. 7. Unless discharge instructions indicate otherwise, you may remove your bandages 24-48 hours after surgery, and you may shower at that time.  You may have steri-strips (small skin tapes) in place directly over the incision.  These strips should be left on the skin for 7-10 days.  If your surgeon used skin glue on the  incision, you may shower in 24 hours.  The glue will flake off over the next 2-3 weeks.  Any sutures or staples will be removed at the office during your follow-up visit. 8. ACTIVITIES:  You may resume regular (light) daily activities beginning the next day--such as daily self-care, walking, climbing stairs--gradually increasing activities as tolerated.  You may have sexual intercourse when it is comfortable.  Refrain from any heavy lifting or straining until approved by your doctor.  a.You may drive when you are no longer taking prescription pain medication, you can comfortably wear a seatbelt, and you can safely maneuver your car and apply brakes. b.RETURN TO WORK:   _____________________________________________  9.You should see your doctor in the office for a follow-up appointment approximately 2-3 weeks after your surgery.  Make sure that you call for this appointment within a day or two after you arrive home to insure a convenient appointment time. 10.OTHER INSTRUCTIONS: _OK TO SHOWER STARTING TOMORROW ICE PACK, TYLENOL, IBUPROFEN ALSO FOR PAIN NO LIFTING MORE THAN 15 POUNDS FOR 4 WEEKS________________________    _____________________________________  WHEN TO CALL YOUR DOCTOR: 1. Fever over 101.0 2. Inability to urinate 3. Nausea and/or vomiting 4. Extreme swelling or bruising 5. Continued bleeding from incision. 6. Increased pain, redness, or drainage from the incision  The clinic staff is available to answer your questions during regular business hours.  Please dont hesitate to call and ask to speak to one of the nurses for clinical concerns.  If you have a medical emergency, go to the nearest emergency  room or call 911.  A surgeon from [facility] is always on call at the hospital   [address] [place], [place], [place]  [address] ?  [address], [place], [place]   [address] [phone] ? [phone] ? FAX [phone] Web site:  [email or web address]   Post Anesthesia Home Care Instructions  Activity: Get plenty of rest for the remainder of the day. A responsible individual must stay with you for 24 hours following the procedure.  For the next 24 hours, DO NOT: -Drive a car -Paediatric nurse -Drink alcoholic beverages -Take any medication unless instructed by your physician -Make any legal decisions or sign important papers.  Meals: Start with liquid foods such as gelatin or soup. Progress to regular foods as tolerated. Avoid greasy, spicy, heavy foods. If nausea and/or vomiting occur, drink only clear liquids until the nausea and/or vomiting subsides. Call your physician if vomiting continues.  Special Instructions/Symptoms: Your throat may feel dry or sore from the anesthesia or the breathing tube placed in your throat during surgery. If this causes discomfort, gargle with warm salt water. The discomfort should disappear within 24 hours.  If you had a scopolamine patch placed behind your ear for the management of post- operative nausea and/or vomiting:  1. The medication in the patch is effective for 72 hours, after which it should be removed.  Wrap patch in a tissue and discard in the trash. Wash hands thoroughly with soap and water. 2. You may remove the patch earlier than 72 hours if you experience unpleasant side effects which may include dry mouth, dizziness or visual disturbances. 3. Avoid touching the patch. Wash your hands with soap and water after contact with the patch.  Information for Discharge Teaching: EXPAREL (bupivacaine liposome injectable suspension)   Your surgeon or anesthesiologist gave you EXPAREL(bupivacaine) to help control your pain after surgery.   EXPAREL is a local anesthetic that provides pain relief by numbing the tissue around the surgical site.  EXPAREL is designed to release pain medication over time and can control pain for up to 72 hours.  Depending on how you  respond to EXPAREL, you may require less pain medication during your recovery.  Possible side effects:  Temporary loss of sensation or ability to move in the area where bupivacaine was injected.  Nausea, vomiting, constipation  Rarely, numbness and tingling in your mouth or lips, lightheadedness, or anxiety may occur.  Call your doctor right away if you think you may be experiencing any of these sensations, or if you have other questions regarding possible side effects.  Follow all other discharge instructions given to you by your surgeon or nurse. Eat a healthy diet and drink plenty of water or other fluids.  If you return to the hospital for any reason within 96 hours following the administration of EXPAREL, it is important for health care providers to know that you have received this anesthetic. A teal colored band has been placed on your arm with the date, time and amount of EXPAREL you have received in order to alert and inform your health care providers. Please leave this armband in place for the full 96 hours following administration, and then you may remove the band.

## 2019-02-28 NOTE — Anesthesia Procedure Notes (Signed)
Anesthesia Regional Block: TAP block   Pre-Anesthetic Checklist: ,, timeout performed, Correct Patient, Correct Site, Correct Laterality, Correct Procedure, Correct Position, site marked, Risks and benefits discussed,  Surgical consent,  Pre-op evaluation,  At surgeon's request and post-op pain management  Laterality: Left  Prep: chloraprep       Needles:  Injection technique: Single-shot  Needle Type: Echogenic Stimulator Needle     Needle Length: 5cm  Needle Gauge: 22     Additional Needles:   Procedures:, nerve stimulator,,, ultrasound used (permanent image in chart),,,,  Narrative:  Start time: 02/28/2019 12:00 PM End time: 02/28/2019 12:09 PM Injection made incrementally with aspirations every 5 mL.  Performed by: Personally  Anesthesiologist: Janeece Riggers, MD  Additional Notes: Functioning IV was confirmed and monitors were applied.  A 51mm 22ga Arrow echogenic stimulator needle was used. Sterile prep and drape,hand hygiene and sterile gloves were used. Ultrasound guidance: relevant anatomy identified, needle position confirmed, local anesthetic spread visualized around nerve(s)., vascular puncture avoided.  Image printed for medical record. Negative aspiration and negative test dose prior to incremental administration of local anesthetic. The patient tolerated the procedure well.

## 2019-02-28 NOTE — Transfer of Care (Signed)
Immediate Anesthesia Transfer of Care Note   Last Vitals:  Vitals Value Taken Time  BP 164/89 02/28/19 1434  Temp 36.5 C 02/28/19 1434  Pulse 71 02/28/19 1434  Resp 11 02/28/19 1434  SpO2 93 % 02/28/19 1434    Last Pain:  Vitals:   02/28/19 1434  TempSrc:   PainSc: 0-No pain      Patients Stated Pain Goal: 5 (02/28/19 1022)  Immediate Anesthesia Transfer of Care Note  Patient: Evan Mitchell  Procedure(s) Performed: Procedure(s) (LRB): LEFT INGUINAL HERNIA REPAIR WITH MESH (Left)  Patient Location: PACU  Anesthesia Type: General  Level of Consciousness:sleepy  Airway & Oxygen Therapy: Patient Spontanous Breathing and Patient connected to nasal cannula oxygen, oral airway in place.  Post-op Assessment: Report given to PACU RN and Post -op Vital signs reviewed and stable  Post vital signs: Reviewed and stable  Complications: No apparent anesthesia complications

## 2019-02-28 NOTE — Progress Notes (Signed)
Assisted Dr. Ambrose Pancoast with left, ultrasound guided, transabdominal plane block. Side rails up, monitors on throughout procedure. See vital signs in flow sheet. Tolerated Procedure well.

## 2019-02-28 NOTE — Op Note (Signed)
   Evan Mitchell 02/28/2019   Pre-op Diagnosis:  Recurrent left inguinal hernia     Post-op Diagnosis: same  Procedure(s): REPAIR RECURRENT LEFT INGUINAL HERNIA WITH MESH  Surgeon(s): Coralie Keens, MD  Anesthesia: General  Staff:  Circulator: Morton Amy, RN Scrub Person: Key, Lannette Donath I  Estimated Blood Loss: Minimal               Findings: The patient was found to recurrent direct as well as indirect left inguinal hernia.  Procedure: The patient was identified in the holding area as the correct patient.  Anesthesia performed a tap block.  He was then taken to the operating room.  He was placed upon the operating table and general anesthesia was induced.  His left inguinal area was then prepped and draped in the usual sterile fashion.  I made incision through the patient's previous scar after anesthetized skin with lidocaine.  I then dissected down through Scarpa's fascia to the external lead fascia with the cautery.  I then opened external beak fascia toward the internal and external rings.  The patient had a direct hernia sac as well as a much smaller indirect hernia sac.  I dissected both sac down to the base and then closed off both with 2-0 silk sutures before excising the sacs. I controlled the testicular cord and structures with a Penrose drain.  I next brought a piece of Prolene pro-grip left-sided mesh from [employer] onto the field.  I placed against the tubercle and then brought around the cord structures.  I then sutured the mesh in place circumferentially with interrupted 2-0 Vicryl sutures.  Good coverage of the inguinal floor and cord structure appeared to be achieved.  I was then able to close external oblique with 2-0 Vicryl sutures.  Scarpa's fascia was closed with interrupted 3-0 Vicryl sutures and skin was closed with running 4-0 Monocryl.  Dermabond was then applied.  The patient tolerated the procedure well.  All the counts were correct at the end of the  procedure.  The patient was then extubated in the operating room and taken in a stable condition to the recovery room.          Coralie Keens   Date: 02/28/2019  Time: 1:20 PM

## 2019-02-28 NOTE — Anesthesia Postprocedure Evaluation (Signed)
Anesthesia Post Note  Patient: Evan Mitchell  Procedure(s) Performed: LEFT INGUINAL HERNIA REPAIR WITH MESH (Left Groin)     Anesthesia Type: General    Last Vitals:  Vitals:   02/28/19 1400 02/28/19 1434  BP: (!) 160/86 (!) 164/89  Pulse: 74 71  Resp: (!) 22 11  Temp:  36.5 C  SpO2: 94% 93%    Last Pain:  Vitals:   02/28/19 1434  TempSrc:   PainSc: 0-No pain                 Callyn Severtson

## 2019-02-28 NOTE — Interval H&P Note (Signed)
History and Physical Interval Note:no change in H and P  02/28/2019 11:23 AM  Evan Mitchell  has presented today for surgery, with the diagnosis of left inguinal hernia.  The various methods of treatment have been discussed with the patient and family. After consideration of risks, benefits and other options for treatment, the patient has consented to  Procedure(s) with comments: LEFT INGUINAL HERNIA REPAIR WITH MESH (Left) - GENERAL AND TAP BLOCK ANESTHESIA as a surgical intervention.  The patient's history has been reviewed, patient examined, no change in status, stable for surgery.  I have reviewed the patient's chart and labs.  Questions were answered to the patient's satisfaction.     Coralie Keens

## 2019-02-28 NOTE — Anesthesia Preprocedure Evaluation (Addendum)
Anesthesia Evaluation  Patient identified by MRN, date of birth, ID band Patient awake    Reviewed: Allergy & Precautions, H&P , NPO status , Patient's Chart, lab work & pertinent test results, reviewed documented beta blocker date and time   Airway Mallampati: II  TM Distance: >3 FB Neck ROM: full    Dental no notable dental hx. (+) Teeth Intact, Dental Advisory Given, Caps,    Pulmonary neg pulmonary ROS, former smoker,    Pulmonary exam normal breath sounds clear to auscultation       Cardiovascular Exercise Tolerance: Good hypertension, negative cardio ROS   Rhythm:regular Rate:Normal     Neuro/Psych negative neurological ROS  negative psych ROS   GI/Hepatic negative GI ROS, Neg liver ROS,   Endo/Other  negative endocrine ROS  Renal/GU negative Renal ROS  negative genitourinary   Musculoskeletal   Abdominal   Peds  Hematology negative hematology ROS (+)   Anesthesia Other Findings   Reproductive/Obstetrics negative OB ROS                            Anesthesia Physical Anesthesia Plan  ASA: II  Anesthesia Plan: General   Post-op Pain Management: GA combined w/ Regional for post-op pain   Induction:   PONV Risk Score and Plan: 2 and Ondansetron and Promethazine  Airway Management Planned: Oral ETT and LMA  Additional Equipment:   Intra-op Plan:   Post-operative Plan: Extubation in OR  Informed Consent: I have reviewed the patients History and Physical, chart, labs and discussed the procedure including the risks, benefits and alternatives for the proposed anesthesia with the patient or authorized representative who has indicated his/her understanding and acceptance.     Dental Advisory Given  Plan Discussed with: CRNA, Anesthesiologist and Surgeon  Anesthesia Plan Comments: (Discussed both nerve block for pain relief post-op and GA; including NV, sore throat, dental  injury, and pulmonary complications)        Anesthesia Quick Evaluation

## 2019-02-28 NOTE — Anesthesia Procedure Notes (Addendum)
Procedure Name: LMA Insertion Date/Time: 02/28/2019 12:42 PM Performed by: Janeece Riggers, MD Pre-anesthesia Checklist: Patient identified, Emergency Drugs available, Suction available and Patient being monitored Patient Re-evaluated:Patient Re-evaluated prior to induction Oxygen Delivery Method: Circle system utilized Preoxygenation: Pre-oxygenation with 100% oxygen Induction Type: IV induction Ventilation: Mask ventilation without difficulty LMA: LMA inserted LMA Size: 5.0 Number of attempts: 1 Airway Equipment and Method: Bite block Placement Confirmation: positive ETCO2 Tube secured with: Tape Dental Injury: Teeth and Oropharynx as per pre-operative assessment

## 2019-03-01 ENCOUNTER — Encounter (HOSPITAL_BASED_OUTPATIENT_CLINIC_OR_DEPARTMENT_OTHER): Payer: Self-pay | Admitting: Surgery

## 2019-03-13 ENCOUNTER — Other Ambulatory Visit: Payer: Self-pay | Admitting: Cardiology

## 2019-03-13 MED ORDER — CARVEDILOL 25 MG PO TABS: 25.0000 mg | ORAL_TABLET | Freq: Two times a day (BID) | ORAL | 0 refills | Status: DC

## 2019-03-13 NOTE — Telephone Encounter (Signed)
°*  STAT* If patient is at the pharmacy, call can be transferred to refill team.   1. Which medications need to be refilled? (please list name of each medication and dose if known)  carvedilol (COREG) 25 MG tablet  2. Which pharmacy/location (including street and city if local pharmacy) is medication to be sent to?  [facility] Phone: [phone] Fax: [phone] EDI #: [id number]  3. Do they need a 30 day or 90 day supply? 90 day with 3 refills.  Wife states a new rx must be sent to the pharmacy. She would have asked at his appointment in June, but that appointment was cancelled.  Wife also  states that they have been using [facility] for over two years, and does not understand why that pharmacy is not listed in the patient's chart.

## 2019-03-29 DIAGNOSIS — I10 Essential (primary) hypertension: Secondary | ICD-10-CM | POA: Diagnosis not present

## 2019-03-29 DIAGNOSIS — Z125 Encounter for screening for malignant neoplasm of prostate: Secondary | ICD-10-CM | POA: Diagnosis not present

## 2019-03-29 DIAGNOSIS — R7301 Impaired fasting glucose: Secondary | ICD-10-CM | POA: Diagnosis not present

## 2019-03-29 DIAGNOSIS — E781 Pure hyperglyceridemia: Secondary | ICD-10-CM | POA: Diagnosis not present

## 2019-04-04 DIAGNOSIS — M109 Gout, unspecified: Secondary | ICD-10-CM | POA: Diagnosis not present

## 2019-04-04 DIAGNOSIS — M1990 Unspecified osteoarthritis, unspecified site: Secondary | ICD-10-CM | POA: Diagnosis not present

## 2019-04-04 DIAGNOSIS — Z0000 Encounter for general adult medical examination without abnormal findings: Secondary | ICD-10-CM | POA: Diagnosis not present

## 2019-04-04 DIAGNOSIS — N529 Male erectile dysfunction, unspecified: Secondary | ICD-10-CM | POA: Diagnosis not present

## 2019-04-04 DIAGNOSIS — E781 Pure hyperglyceridemia: Secondary | ICD-10-CM | POA: Diagnosis not present

## 2019-04-04 DIAGNOSIS — R7301 Impaired fasting glucose: Secondary | ICD-10-CM | POA: Diagnosis not present

## 2019-04-04 DIAGNOSIS — F419 Anxiety disorder, unspecified: Secondary | ICD-10-CM | POA: Diagnosis not present

## 2019-04-04 DIAGNOSIS — I454 Nonspecific intraventricular block: Secondary | ICD-10-CM | POA: Diagnosis not present

## 2019-04-04 DIAGNOSIS — Z1331 Encounter for screening for depression: Secondary | ICD-10-CM | POA: Diagnosis not present

## 2019-04-04 DIAGNOSIS — R972 Elevated prostate specific antigen [PSA]: Secondary | ICD-10-CM | POA: Diagnosis not present

## 2019-04-04 DIAGNOSIS — S90822A Blister (nonthermal), left foot, initial encounter: Secondary | ICD-10-CM | POA: Diagnosis not present

## 2019-04-04 DIAGNOSIS — I10 Essential (primary) hypertension: Secondary | ICD-10-CM | POA: Diagnosis not present

## 2019-04-05 DIAGNOSIS — R82998 Other abnormal findings in urine: Secondary | ICD-10-CM | POA: Diagnosis not present

## 2019-04-05 DIAGNOSIS — I10 Essential (primary) hypertension: Secondary | ICD-10-CM | POA: Diagnosis not present

## 2019-04-12 DIAGNOSIS — L570 Actinic keratosis: Secondary | ICD-10-CM | POA: Diagnosis not present

## 2019-04-12 DIAGNOSIS — D1801 Hemangioma of skin and subcutaneous tissue: Secondary | ICD-10-CM | POA: Diagnosis not present

## 2019-04-12 DIAGNOSIS — D044 Carcinoma in situ of skin of scalp and neck: Secondary | ICD-10-CM | POA: Diagnosis not present

## 2019-04-12 DIAGNOSIS — M67472 Ganglion, left ankle and foot: Secondary | ICD-10-CM | POA: Diagnosis not present

## 2019-04-12 DIAGNOSIS — Z85828 Personal history of other malignant neoplasm of skin: Secondary | ICD-10-CM | POA: Diagnosis not present

## 2019-04-12 DIAGNOSIS — L821 Other seborrheic keratosis: Secondary | ICD-10-CM | POA: Diagnosis not present

## 2019-04-12 DIAGNOSIS — D225 Melanocytic nevi of trunk: Secondary | ICD-10-CM | POA: Diagnosis not present

## 2019-04-12 DIAGNOSIS — C44319 Basal cell carcinoma of skin of other parts of face: Secondary | ICD-10-CM | POA: Diagnosis not present

## 2019-04-12 DIAGNOSIS — C4441 Basal cell carcinoma of skin of scalp and neck: Secondary | ICD-10-CM | POA: Diagnosis not present

## 2019-04-19 ENCOUNTER — Telehealth: Payer: Self-pay | Admitting: Cardiology

## 2019-04-19 NOTE — Telephone Encounter (Signed)
Spoke to patient's wife lab results given.

## 2019-04-19 NOTE — Telephone Encounter (Signed)
Follow Up:; ° ° °Returning your call. °

## 2019-05-21 ENCOUNTER — Encounter

## 2019-05-28 DIAGNOSIS — M7021 Olecranon bursitis, right elbow: Secondary | ICD-10-CM | POA: Diagnosis not present

## 2019-05-28 DIAGNOSIS — Z23 Encounter for immunization: Secondary | ICD-10-CM | POA: Diagnosis not present

## 2019-05-30 DIAGNOSIS — Z23 Encounter for immunization: Secondary | ICD-10-CM | POA: Diagnosis not present

## 2019-06-05 DIAGNOSIS — H2513 Age-related nuclear cataract, bilateral: Secondary | ICD-10-CM | POA: Diagnosis not present

## 2019-06-05 DIAGNOSIS — H11041 Peripheral pterygium, stationary, right eye: Secondary | ICD-10-CM | POA: Diagnosis not present

## 2019-06-05 DIAGNOSIS — H31012 Macula scars of posterior pole (postinflammatory) (post-traumatic), left eye: Secondary | ICD-10-CM | POA: Diagnosis not present

## 2019-06-05 DIAGNOSIS — H16223 Keratoconjunctivitis sicca, not specified as Sjogren's, bilateral: Secondary | ICD-10-CM | POA: Diagnosis not present

## 2019-06-18 ENCOUNTER — Other Ambulatory Visit: Payer: Self-pay | Admitting: Cardiology

## 2019-06-18 MED ORDER — CARVEDILOL 25 MG PO TABS: 25.0000 mg | ORAL_TABLET | Freq: Two times a day (BID) | ORAL | 0 refills | Status: DC

## 2019-06-18 NOTE — Telephone Encounter (Signed)
New Message      *STAT* If patient is at the pharmacy, call can be transferred to refill team.   1. Which medications need to be refilled? (please list name of each medication and dose if known) Carvedilol 25mg   2. Which pharmacy/location (including street and city if local pharmacy) is medication to be sent to? [facility]  3. Do they need a 30 day or 90 day supply? 90 days

## 2019-06-18 NOTE — Telephone Encounter (Signed)
Rx(s) sent to pharmacy electronically.  

## 2019-06-20 NOTE — Progress Notes (Signed)
Virtual Visit via Video Note   This visit type was conducted due to national recommendations for restrictions regarding the COVID-19 Pandemic (e.g. social distancing) in an effort to limit this patient's exposure and mitigate transmission in our community.  Due to his co-morbid illnesses, this patient is at least at moderate risk for complications without adequate follow up.  This format is felt to be most appropriate for this patient at this time.  All issues noted in this document were discussed and addressed.  A limited physical exam was performed with this format.  Please refer to the patient's chart for his consent to telehealth for [facility]   Date:  06/21/2019   ID:  Evan Mitchell, DOB 09/21/1945, MRN [id number]  Patient Location: Home Provider Location: Home  PCP:  Prince Solian, MD  Cardiologist:  Abednego Yeates [place] MD Electrophysiologist:  None   Evaluation Performed:  Follow-Up Visit  Chief Complaint:  HTN  History of Present Illness:    Evan Mitchell is a 73 y.o. male with history of  hypertension and hyperlipidemia. He has a chronic LBBB.   He underwent repair of an inguinal hernia in June 2020. This took him about a month to recover. He has a ruptured bursa on his right elbow. This is getting better. His BP has been in excellent control. No complaints today.  The patient does not have symptoms concerning for COVID-19 infection (fever, chills, cough, or new shortness of breath).    Past Medical History:  Diagnosis Date  . Allergic rhinitis   . Anxiety   . Arthritis    hand and feet  . HTN (hypertension)   . Hypertriglyceridemia    Mild  . Impaired fasting glucose   . LBBB (left bundle branch block) 12/12/2012   Past Surgical History:  Procedure Laterality Date  . COLONOSCOPY    . HERNIA REPAIR  1995  . INGUINAL HERNIA REPAIR Left 02/28/2019   Procedure: LEFT INGUINAL HERNIA REPAIR WITH MESH;  Surgeon: Coralie Keens, MD;  Location: [facility];  Service: General;  Laterality: Left;  . TONSILLECTOMY  1947     Current Meds  Medication Sig  . Acetaminophen (TYLENOL 8 HOUR PO) Take 2 tablets by mouth at bedtime as needed (pain).  Marland Kitchen allopurinol (ZYLOPRIM) 300 MG tablet Take 1 tablet by mouth daily.  Marland Kitchen aspirin EC 81 MG tablet Take 81 mg by mouth daily.  . Calcium Carbonate-Vitamin D (CALCIUM + D PO) Take 1,200 mg by mouth daily.  . carvedilol (COREG) 25 MG tablet Take 1 tablet (25 mg total) by mouth 2 (two) times daily with a meal.  . GLUCOSAMINE-CHONDROITIN PO Take by mouth 3 (three) times daily.  . hydrochlorothiazide (HYDRODIURIL) 25 MG tablet TAKE 1 TABLET (25 MG TOTAL) BY MOUTH DAILY.  Marland Kitchen lisinopril (PRINIVIL,ZESTRIL) 40 MG tablet Take 40 mg by mouth daily.  . Melatonin 3 MG TABS Take 6 mg by mouth at bedtime.  . Multiple Vitamins-Minerals (PRESERVISION AREDS PO) Take by mouth 2 (two) times daily.  . Omega-3 Fatty Acids (FISH OIL PO) Take 1 tablet by mouth 3 (three) times daily.  . simvastatin (ZOCOR) 40 MG tablet Take 40 mg by mouth every evening.  . vitamin C (ASCORBIC ACID) 500 MG tablet Take 500 mg by mouth daily.     Allergies:   Aleve [naproxen sodium] and Codeine   Social History   Tobacco Use  . Smoking status: Former Smoker    Quit date: 09/19/1976  Years since quitting: 42.7  . Smokeless tobacco: Never Used  Substance Use Topics  . Alcohol use: No    Alcohol/week: 0.0 standard drinks  . Drug use: No     Family Hx: The patient's family history includes Diabetes in his mother; Heart attack in his mother; Hypertension in his mother; Skin cancer in his mother.  ROS:   Please see the history of present illness.     All other systems reviewed and are negative.   Prior CV studies:   The following studies were reviewed today:  none  Labs/Other Tests and Data Reviewed:    EKG:  No ECG reviewed.  Recent Labs: 11/30/2018: ALT 46; Platelets 135 02/28/2019: BUN 19; Creatinine, Ser 0.80;  Hemoglobin 17.3; Potassium 4.4; Sodium 140   Recent Lipid Panel No results found for: CHOL, TRIG, HDL, CHOLHDL, LDLCALC, LDLDIRECT   Dated 03/29/19: cholesterol 131, triglycerides 90, HDL 43, LDL 70. A1c 5.7%. Normal CMET, CBC, TSH  Wt Readings from Last 3 Encounters:  06/21/19 205 lb (93 kg)  02/28/19 203 lb 9.6 oz (92.4 kg)  02/14/18 208 lb 9.6 oz (94.6 kg)     Objective:    Vital Signs:  BP 113/69   Pulse 72   Ht 6' (1.829 m)   Wt 205 lb (93 kg)   BMI 27.80 kg/m    VITAL SIGNS:  reviewed  General: NAD HEENT normal Respirations unlabored.  Neuro alert and oriented x 3. Mood normal Right elbow busae is swollen.  ASSESSMENT & PLAN:    1. Hypertension. Blood pressure is well controlled on combination of carvedilol, lisinopril, and HCTZ.   2. Left bundle branch block- intermittent. No ischemia noted on prior stress testing. Ejection fraction was normal by prior echo.   3. Hypercholesterolemia. On statin with excellent results.  COVID-19 Education: The signs and symptoms of COVID-19 were discussed with the patient and how to seek care for testing (follow up with PCP or arrange E-visit).  The importance of social distancing was discussed today.  Time:   Today, I have spent 5 minutes with the patient with telehealth technology discussing the above problems.     Medication Adjustments/Labs and Tests Ordered: Current medicines are reviewed at length with the patient today.  Concerns regarding medicines are outlined above.   Tests Ordered: No orders of the defined types were placed in this encounter.   Medication Changes: No orders of the defined types were placed in this encounter.   Follow Up:  In Person in 1 year(s)  Signed, Evan Schill [place], MD  06/21/2019 2:46 PM    [place]

## 2019-06-21 ENCOUNTER — Encounter: Payer: Self-pay | Admitting: Cardiology

## 2019-06-21 ENCOUNTER — Telehealth (INDEPENDENT_AMBULATORY_CARE_PROVIDER_SITE_OTHER): Payer: Medicare Other | Admitting: Cardiology

## 2019-06-21 VITALS — BP 113/69 | HR 72 | Ht 72.0 in | Wt 205.0 lb

## 2019-06-21 DIAGNOSIS — E7800 Pure hypercholesterolemia, unspecified: Secondary | ICD-10-CM

## 2019-06-21 DIAGNOSIS — I447 Left bundle-branch block, unspecified: Secondary | ICD-10-CM

## 2019-06-21 DIAGNOSIS — I10 Essential (primary) hypertension: Secondary | ICD-10-CM

## 2019-06-21 NOTE — Patient Instructions (Signed)
Medication Instructions:  Continue same medications If you need a refill on your cardiac medications before your next appointment, please call your pharmacy.   Lab work: None ordered  Testing/Procedures: None ordered  Follow-Up: At [facility], you and your health needs are our priority.  As part of our continuing mission to provide you with exceptional heart care, we have created designated Provider Care Teams.  These Care Teams include your primary Cardiologist (physician) and Advanced Practice Providers (APPs -  Physician Assistants and Nurse Practitioners) who all work together to provide you with the care you need, when you need it. . Schedule follow up appointment in 1 year    Call in July to schedule Oct appointment

## 2019-07-11 DIAGNOSIS — R7301 Impaired fasting glucose: Secondary | ICD-10-CM | POA: Diagnosis not present

## 2019-07-11 DIAGNOSIS — R972 Elevated prostate specific antigen [PSA]: Secondary | ICD-10-CM | POA: Diagnosis not present

## 2019-08-07 DIAGNOSIS — R972 Elevated prostate specific antigen [PSA]: Secondary | ICD-10-CM | POA: Diagnosis not present

## 2019-09-23 DIAGNOSIS — H16223 Keratoconjunctivitis sicca, not specified as Sjogren's, bilateral: Secondary | ICD-10-CM | POA: Diagnosis not present

## 2019-09-23 DIAGNOSIS — H11041 Peripheral pterygium, stationary, right eye: Secondary | ICD-10-CM | POA: Diagnosis not present

## 2019-09-23 DIAGNOSIS — H2513 Age-related nuclear cataract, bilateral: Secondary | ICD-10-CM | POA: Diagnosis not present

## 2019-09-23 DIAGNOSIS — H31012 Macula scars of posterior pole (postinflammatory) (post-traumatic), left eye: Secondary | ICD-10-CM | POA: Diagnosis not present

## 2019-09-24 ENCOUNTER — Other Ambulatory Visit: Payer: Self-pay | Admitting: Urology

## 2019-09-24 ENCOUNTER — Other Ambulatory Visit (HOSPITAL_COMMUNITY): Payer: Self-pay | Admitting: Urology

## 2019-09-24 DIAGNOSIS — C61 Malignant neoplasm of prostate: Secondary | ICD-10-CM

## 2019-10-01 ENCOUNTER — Other Ambulatory Visit: Payer: Self-pay | Admitting: Cardiology

## 2019-10-01 MED ORDER — CARVEDILOL 25 MG PO TABS: 25.0000 mg | ORAL_TABLET | Freq: Two times a day (BID) | ORAL | 0 refills | Status: DC

## 2019-10-07 DIAGNOSIS — I10 Essential (primary) hypertension: Secondary | ICD-10-CM | POA: Diagnosis not present

## 2019-10-07 DIAGNOSIS — R7301 Impaired fasting glucose: Secondary | ICD-10-CM | POA: Diagnosis not present

## 2019-10-07 DIAGNOSIS — F419 Anxiety disorder, unspecified: Secondary | ICD-10-CM | POA: Diagnosis not present

## 2019-10-07 DIAGNOSIS — C61 Malignant neoplasm of prostate: Secondary | ICD-10-CM | POA: Diagnosis not present

## 2019-10-10 ENCOUNTER — Ambulatory Visit (HOSPITAL_COMMUNITY)
Admission: RE | Admit: 2019-10-10 | Discharge: 2019-10-10 | Disposition: A | Discharge status: Home / Self Care | Payer: Medicare Other | Source: Ambulatory Visit | Attending: Urology | Admitting: Urology

## 2019-10-10 ENCOUNTER — Encounter (HOSPITAL_COMMUNITY)
Admission: RE | Admit: 2019-10-10 | Discharge: 2019-10-10 | Disposition: A | Discharge status: Home / Self Care | Payer: Medicare Other | Source: Ambulatory Visit | Attending: Urology | Admitting: Urology

## 2019-10-10 ENCOUNTER — Other Ambulatory Visit: Payer: Self-pay

## 2019-10-10 DIAGNOSIS — C61 Malignant neoplasm of prostate: Secondary | ICD-10-CM | POA: Diagnosis not present

## 2019-10-10 MED ORDER — TECHNETIUM TC 99M MEDRONATE IV KIT
20.9000 | PACK | Freq: Once | INTRAVENOUS | Status: AC
  Administered 2019-10-10: 20.9 via INTRAVENOUS

## 2019-10-17 DIAGNOSIS — H2512 Age-related nuclear cataract, left eye: Secondary | ICD-10-CM | POA: Diagnosis not present

## 2019-10-23 DIAGNOSIS — N403 Nodular prostate with lower urinary tract symptoms: Secondary | ICD-10-CM | POA: Diagnosis not present

## 2019-10-23 DIAGNOSIS — C61 Malignant neoplasm of prostate: Secondary | ICD-10-CM | POA: Diagnosis not present

## 2019-10-23 DIAGNOSIS — R3915 Urgency of urination: Secondary | ICD-10-CM | POA: Diagnosis not present

## 2019-10-23 DIAGNOSIS — R972 Elevated prostate specific antigen [PSA]: Secondary | ICD-10-CM | POA: Diagnosis not present

## 2019-10-23 DIAGNOSIS — R918 Other nonspecific abnormal finding of lung field: Secondary | ICD-10-CM | POA: Diagnosis not present

## 2019-10-24 ENCOUNTER — Encounter: Payer: Self-pay | Admitting: *Deleted

## 2019-10-24 ENCOUNTER — Other Ambulatory Visit: Payer: Self-pay | Admitting: Urology

## 2019-10-24 ENCOUNTER — Encounter (HOSPITAL_COMMUNITY): Payer: Self-pay

## 2019-10-24 MED ORDER — GEMCITABINE CHEMO FOR BLADDER INSTILLATION 2000 MG: 2000.0000 mg | Freq: Once | INTRAVENOUS | Status: AC

## 2019-10-24 NOTE — Patient Instructions (Addendum)
DUE TO COVID-19 ONLY ONE VISITOR IS ALLOWED TO COME WITH YOU AND STAY IN THE WAITING ROOM ONLY DURING PRE OP AND PROCEDURE. THE ONE VISITOR MAY VISIT WITH YOU IN YOUR PRIVATE ROOM DURING VISITING HOURS ONLY!!   COVID SWAB TESTING COMPLETED ON:  Friday, Feb. 5, 2021  (Must self quarantine after testing. Follow instructions on handout.)             Your procedure is scheduled on: Tuesday, Feb. 9, 2021   Report to [facility] Main  Entrance    Report to admitting at 7:45 AM   Call this number if you have problems the morning of surgery [phone]   Do not eat food or drink liquids :After Midnight.   Oral Hygiene is also important to reduce your risk of infection.                                    Remember - BRUSH YOUR TEETH THE MORNING OF SURGERY WITH YOUR REGULAR TOOTHPASTE   Do NOT smoke after Midnight   Take these medicines the morning of surgery with A SIP OF WATER: Allopurinol, Carvedilol   May use eyedrops morning of surgery                               You may not have any metal on your body including jewelry, and body piercings             Do not wear lotions, powders, perfumes/cologne, or deodorant                           Men may shave face and neck.   Do not bring valuables to the hospital. [place].   Contacts, dentures or bridgework may not be worn into surgery.    Patients discharged the day of surgery will not be allowed to drive home.   Special Instructions: Bring a copy of your healthcare power of attorney and living will documents         the day of surgery if you haven't scanned them in before.              Please read over the following fact sheets you were given:  [facility] - Preparing for Surgery Before surgery, you can play an important role.  Because skin is not sterile, your skin needs to be as free of germs as possible.  You can reduce the number of germs on your skin by washing with CHG  (chlorahexidine gluconate) soap before surgery.  CHG is an antiseptic cleaner which kills germs and bonds with the skin to continue killing germs even after washing. Please DO NOT use if you have an allergy to CHG or antibacterial soaps.  If your skin becomes reddened/irritated stop using the CHG and inform your nurse when you arrive at Short Stay. Do not shave (including legs and underarms) for at least 48 hours prior to the first CHG shower.  You may shave your face/neck.  Please follow these instructions carefully:  1.  Shower with CHG Soap the night before surgery and the  morning of surgery.  2.  If you choose to wash your hair, wash your hair first  as usual with your normal  shampoo.  3.  After you shampoo, rinse your hair and body thoroughly to remove the shampoo.                             4.  Use CHG as you would any other liquid soap.  You can apply chg directly to the skin and wash.  Gently with a scrungie or clean washcloth.  5.  Apply the CHG Soap to your body ONLY FROM THE NECK DOWN.   Do   not use on face/ open                           Wound or open sores. Avoid contact with eyes, ears mouth and   genitals (private parts).                       Wash face,  Genitals (private parts) with your normal soap.             6.  Wash thoroughly, paying special attention to the area where your    surgery  will be performed.  7.  Thoroughly rinse your body with warm water from the neck down.  8.  DO NOT shower/wash with your normal soap after using and rinsing off the CHG Soap.                9.  Pat yourself dry with a clean towel.            10.  Wear clean pajamas.            11.  Place clean sheets on your bed the night of your first shower and do not  sleep with pets. Day of Surgery : Do not apply any lotions/deodorants the morning of surgery.  Please wear clean clothes to the hospital/surgery center.  FAILURE TO FOLLOW THESE INSTRUCTIONS MAY RESULT IN THE CANCELLATION OF YOUR  SURGERY  PATIENT SIGNATURE_________________________________  NURSE SIGNATURE__________________________________  ________________________________________________________________________ Marland Kitchen

## 2019-10-25 ENCOUNTER — Telehealth: Payer: Self-pay | Admitting: Medical Oncology

## 2019-10-25 ENCOUNTER — Ambulatory Visit (HOSPITAL_COMMUNITY)
Admission: RE | Admit: 2019-10-25 | Discharge: 2019-10-25 | Disposition: A | Discharge status: Home / Self Care | Payer: Medicare Other | Source: Ambulatory Visit | Attending: Radiation Oncology | Admitting: Radiation Oncology

## 2019-10-25 DIAGNOSIS — Z20822 Contact with and (suspected) exposure to covid-19: Secondary | ICD-10-CM | POA: Insufficient documentation

## 2019-10-25 DIAGNOSIS — Z01812 Encounter for preprocedural laboratory examination: Secondary | ICD-10-CM | POA: Diagnosis not present

## 2019-10-25 LAB — SARS CORONAVIRUS 2 (TAT 6-24 HRS): SARS Coronavirus 2: NEGATIVE

## 2019-10-25 NOTE — Telephone Encounter (Signed)
I called pt to introduce myself as the [facility] Nurse Navigator and the Coordinator of the [facility].  1. I confirmed with the patient he is aware of his referral to the clinic 2/26. I offered him a telehealth or video visit, but he would like to attend in person. I discussed the COVID restrictions and screening process.  2. I discussed the format of the clinic and the physicians he will be seeing that day.  3. I discussed where the clinic is located and how to contact me.  4. I confirmed his address and informed him I would be mailing a packet of information and forms to be completed. I asked him to bring them with him the day of his appointment.   He voiced understanding of the above. I asked him to call me if he has any questions or concerns regarding his appointments or the forms he needs to complete.

## 2019-10-28 ENCOUNTER — Encounter (HOSPITAL_COMMUNITY)
Admission: RE | Admit: 2019-10-28 | Discharge: 2019-10-28 | Disposition: A | Discharge status: Home / Self Care | Payer: Medicare Other | Source: Ambulatory Visit | Attending: Urology | Admitting: Urology

## 2019-10-28 ENCOUNTER — Encounter (HOSPITAL_COMMUNITY): Payer: Self-pay

## 2019-10-28 ENCOUNTER — Other Ambulatory Visit: Payer: Self-pay

## 2019-10-28 ENCOUNTER — Telehealth: Payer: Self-pay | Admitting: Medical Oncology

## 2019-10-28 DIAGNOSIS — Z01818 Encounter for other preprocedural examination: Secondary | ICD-10-CM | POA: Insufficient documentation

## 2019-10-28 DIAGNOSIS — Z8546 Personal history of malignant neoplasm of prostate: Secondary | ICD-10-CM | POA: Insufficient documentation

## 2019-10-28 DIAGNOSIS — I10 Essential (primary) hypertension: Secondary | ICD-10-CM | POA: Diagnosis not present

## 2019-10-28 DIAGNOSIS — Z87891 Personal history of nicotine dependence: Secondary | ICD-10-CM | POA: Insufficient documentation

## 2019-10-28 DIAGNOSIS — Z7982 Long term (current) use of aspirin: Secondary | ICD-10-CM | POA: Insufficient documentation

## 2019-10-28 DIAGNOSIS — D494 Neoplasm of unspecified behavior of bladder: Secondary | ICD-10-CM | POA: Diagnosis not present

## 2019-10-28 DIAGNOSIS — Z79899 Other long term (current) drug therapy: Secondary | ICD-10-CM | POA: Insufficient documentation

## 2019-10-28 DIAGNOSIS — E781 Pure hyperglyceridemia: Secondary | ICD-10-CM | POA: Insufficient documentation

## 2019-10-28 DIAGNOSIS — R7303 Prediabetes: Secondary | ICD-10-CM | POA: Insufficient documentation

## 2019-10-28 DIAGNOSIS — M1990 Unspecified osteoarthritis, unspecified site: Secondary | ICD-10-CM | POA: Diagnosis not present

## 2019-10-28 DIAGNOSIS — I447 Left bundle-branch block, unspecified: Secondary | ICD-10-CM | POA: Insufficient documentation

## 2019-10-28 HISTORY — DX: Unilateral inguinal hernia, without obstruction or gangrene, not specified as recurrent: K40.90

## 2019-10-28 HISTORY — DX: Nausea with vomiting, unspecified: R11.2

## 2019-10-28 HISTORY — DX: Prediabetes: R73.03

## 2019-10-28 HISTORY — DX: Other specified postprocedural states: Z98.890

## 2019-10-28 HISTORY — DX: Malignant neoplasm of prostate: C61

## 2019-10-28 LAB — BASIC METABOLIC PANEL
Anion gap: 8 (ref 5–15)
BUN: 16 mg/dL (ref 8–23)
CO2: 28 mmol/L (ref 22–32)
Calcium: 9.4 mg/dL (ref 8.9–10.3)
Chloride: 105 mmol/L (ref 98–111)
Creatinine, Ser: 0.86 mg/dL (ref 0.61–1.24)
GFR calc Af Amer: >60 mL/min (ref >60)
GFR calc non Af Amer: >60 mL/min (ref >60)
Glucose, Bld: 120 mg/dL — ABNORMAL HIGH (ref 70–99)
Potassium: 3.8 mmol/L (ref 3.5–5.1)
Sodium: 141 mmol/L (ref 135–145)

## 2019-10-28 LAB — CBC
HCT: 51.1 % (ref 39.0–52.0)
Hemoglobin: 16.4 g/dL (ref 13.0–17.0)
MCH: 29.2 pg (ref 26.0–34.0)
MCHC: 32.1 g/dL (ref 30.0–36.0)
MCV: 91.1 fL (ref 80.0–100.0)
Platelets: 130 10*3/uL — ABNORMAL LOW (ref 150–400)
RBC: 5.61 MIL/uL (ref 4.22–5.81)
RDW: 14.1 % (ref 11.5–15.5)
WBC: 5.6 10*3/uL (ref 4.0–10.5)
nRBC: 0 % (ref 0.0–0.2)

## 2019-10-28 NOTE — Progress Notes (Signed)
PCP - Dr. Joyce Copa Cardiologist - Dr. P. [place]. LOV: 06/21/19. EPIC  Chest x-ray -  EKG - 02/28/19. EPIC Stress Test - > 2 years ECHO - > 2 years Cardiac Cath -   Sleep Study -  CPAP -   Fasting Blood Sugar -  Checks Blood Sugar _____ times a day  Blood Thinner Instructions: Aspirin Instructions:As per pt., MD. Told him to continue taking Aspirin 81 mg. Last Dose:  Anesthesia review:   Patient denies shortness of breath, fever, cough and chest pain at PAT appointment   Patient verbalized understanding of instructions that were given to them at the PAT appointment. Patient was also instructed that they will need to review over the PAT instructions again at home before surgery.

## 2019-10-28 NOTE — Progress Notes (Signed)
Anesthesia Chart Review   Case: [id number] Date/Time: 10/29/19 0930   Procedure: TRANSURETHRAL RESECTION OF BLADDER TUMOR (TURBT)  INSTILL GEMCITABINE (N/A )   Anesthesia type: General   Pre-op diagnosis: BLADDER TUMOR   Location: [place] [facility] / [facility]   Surgeons: Irine Seal, MD      DISCUSSION:73 y.o. former smoker (quit 09/19/76) with h/o PONV, HTN, LBBB, pre-diabetes, prostate cancer, bladder tumor scheduled for above procedure 10/29/19 with Dr. Irine Seal.   Last seen by cardiologist, Dr. Peter Martinique 06/21/2019.  Per OV note excellent control of BP.  "Left bundle branch block- intermittent. No ischemia noted on prior stress testing. Ejection fraction was normal by prior echo. " 1 year follow up recommended.   Elevated BP at PAT visit.  Reports h/o white coat syndrome.  Will evaluate DOS.  VS: BP (!) 177/91   Pulse 81   Temp 36.5 C (Oral)   Resp 18   Ht 6' (1.829 m)   Wt 96.7 kg   SpO2 99%   BMI 28.92 kg/m   PROVIDERS: Prince Solian, MD is PCP   Martinique, Peter, MD is Cardiologist  LABS: Labs reviewed: Acceptable for surgery. (all labs ordered are listed, but only abnormal results are displayed)  Labs Reviewed  BASIC METABOLIC PANEL - Abnormal; Notable for the following components:      Result Value   Glucose, Bld 120 (*)    All other components within normal limits  CBC - Abnormal; Notable for the following components:   Platelets 130 (*)    All other components within normal limits  HEMOGLOBIN A1C     IMAGES:   EKG: 02/28/2019 Rate 73 bpm Normal sinus rhythm with sinus arrhythmia Possible anterior infarct, age undetermined  CV: Myocardial Perfusion 12/18/2012 Overall Impression:  This study shows no evidence of scar or ischemia. Wall motion analysis shows some decreased LV function that is probably related to the left bundle branch block. This is a low risk scan.  LV Ejection Fraction: 45%.  LV Wall Motion:   There is some hypokinesis of the septum. This is  probably related to left bundle branch block. There is also some decreased motion at the apex.  Echo 12/14/2012 Study Conclusions   - Left ventricle: Wall thickness was increased in a pattern  of mild LVH. There was mild focal basal hypertrophy of the  septum. Septal-lateral dyssynchrony. Systolic function was  normal. The estimated ejection fraction was in the range  of 55% to 60%. Wall motion was normal; there were no  regional wall motion abnormalities. Doppler parameters are  consistent with abnormal left ventricular relaxation  (grade 1 diastolic dysfunction).  - Mitral valve: Mildly calcified annulus. Trivial  regurgitation.  - Left atrium: The atrium was mildly dilated.  - Right ventricle: The cavity size was normal. Systolic  function was normal.  - Tricuspid valve: Peak RV-RA gradient: 1mm Hg (S).  - Pulmonary arteries: PA peak pressure: 57mm Hg (S).  - Inferior vena cava: The vessel was normal in size; the  respirophasic diameter changes were in the normal range (=  50%); findings are consistent with normal central venous  pressure.  Impressions:   - Normal LV size with mild focal basal septal hypertrophy.  EF 55-60%. Septal-lateral dyssynchrony suggestive of LBBB.  Otherwise, wall motion was normal. Normal RV size and  systolic function. No significant valvular abnormalities.  Transthoracic echocardiography. M-mode, complete 2D,  spectral Doppler, and color Doppler. Height: Height:  185.4cm. Height: 73in. Weight: Weight: 94.3kg.  Weight:  207.6lb. Body mass index: BMI: 27.4kg/m^2. Body surface  area: BSA: 2.50m^2. Blood pressure: 148/80. Patient  status: Outpatient. Location: [facility]   Past Medical History:  Diagnosis Date  . Allergic rhinitis   . Anxiety   . Arthritis    hand and feet  . History of syncope 10/2017  . HTN (hypertension)   . Hypertriglyceridemia    Mild  . Impaired fasting glucose   . Inguinal hernia    Left  . LBBB (left bundle  branch block) 12/12/2012  . PONV (postoperative nausea and vomiting)    nausea,dizziness after anesthesia  . Pre-diabetes    control with diet and exercise  . Prostate cancer [facility])     Past Surgical History:  Procedure Laterality Date  . COLONOSCOPY    . HERNIA REPAIR  1995  . INGUINAL HERNIA REPAIR Left 02/28/2019   Procedure: LEFT INGUINAL HERNIA REPAIR WITH MESH;  Surgeon: Coralie Keens, MD;  Location: [place];  Service: General;  Laterality: Left;  . TONSILLECTOMY  1947    MEDICATIONS: . acetaminophen (TYLENOL) 650 MG CR tablet  . allopurinol (ZYLOPRIM) 300 MG tablet  . aspirin EC 81 MG tablet  . Calcium Carbonate-Vitamin D (CALCIUM + D PO)  . carvedilol (COREG) 25 MG tablet  . GLUCOSAMINE-CHONDROITIN PO  . hydrochlorothiazide (HYDRODIURIL) 25 MG tablet  . lisinopril (PRINIVIL,ZESTRIL) 40 MG tablet  . Melatonin 3 MG TABS  . Multiple Vitamins-Minerals (PRESERVISION AREDS PO)  . Omega-3 Fatty Acids (FISH OIL PO)  . prednisoLONE acetate (PRED FORTE) 1 % ophthalmic suspension  . simvastatin (ZOCOR) 40 MG tablet  . SYSTANE ULTRA 0.4-0.3 % SOLN  . vitamin C (ASCORBIC ACID) 500 MG tablet   No current facility-administered medications for this encounter.   Marland Kitchen gemcitabine (GEMZAR) chemo syringe for bladder instillation 2,000 mg     Maia Plan [facility] Pre-Surgical Testing [phone] 10/28/19  1:55 PM

## 2019-10-28 NOTE — Progress Notes (Signed)
Blood pressure slightly elevated at lab visit, asymptomatic. Patient states he took his medications and he usually has elevated blood pressure when he comes into doctor's office's and hospitals. He monitors he's blood pressure at home and they are normal. Jessica Zanetto P.A. made aware, no new orders received at this time, will recheck blood pressure the morning of surgery.

## 2019-10-28 NOTE — Telephone Encounter (Signed)
Spoke with wife to let her know [place] appointment will be 2/23, arriving at 12:30 pm. Patient and wife had requested moving to 2/12 clinic if possible. Per Dr. Alinda Money, patient is having a bladder biopsy 2/09 and he is not sure the pathology will be available for clinic on 2/12. She voiced understanding.

## 2019-10-29 ENCOUNTER — Encounter (HOSPITAL_COMMUNITY): Payer: Self-pay | Admitting: Urology

## 2019-10-29 ENCOUNTER — Ambulatory Visit (HOSPITAL_COMMUNITY)
Admission: RE | Admit: 2019-10-29 | Discharge: 2019-10-29 | Disposition: A | Discharge status: Home / Self Care | Payer: Medicare Other | Attending: Urology | Admitting: Urology

## 2019-10-29 ENCOUNTER — Other Ambulatory Visit: Payer: Self-pay

## 2019-10-29 ENCOUNTER — Ambulatory Visit (HOSPITAL_COMMUNITY): Payer: Medicare Other | Admitting: Certified Registered Nurse Anesthetist

## 2019-10-29 ENCOUNTER — Ambulatory Visit (HOSPITAL_COMMUNITY): Payer: Medicare Other | Admitting: Physician Assistant

## 2019-10-29 ENCOUNTER — Encounter (HOSPITAL_COMMUNITY)
Admission: RE | Disposition: A | Discharge status: Home / Self Care | Payer: Self-pay | Source: Home / Self Care | Attending: Urology

## 2019-10-29 DIAGNOSIS — Z87891 Personal history of nicotine dependence: Secondary | ICD-10-CM | POA: Diagnosis not present

## 2019-10-29 DIAGNOSIS — N403 Nodular prostate with lower urinary tract symptoms: Secondary | ICD-10-CM | POA: Diagnosis not present

## 2019-10-29 DIAGNOSIS — D494 Neoplasm of unspecified behavior of bladder: Secondary | ICD-10-CM | POA: Diagnosis not present

## 2019-10-29 DIAGNOSIS — E785 Hyperlipidemia, unspecified: Secondary | ICD-10-CM | POA: Diagnosis not present

## 2019-10-29 DIAGNOSIS — C670 Malignant neoplasm of trigone of bladder: Secondary | ICD-10-CM | POA: Insufficient documentation

## 2019-10-29 DIAGNOSIS — D090 Carcinoma in situ of bladder: Secondary | ICD-10-CM | POA: Diagnosis not present

## 2019-10-29 DIAGNOSIS — Z7982 Long term (current) use of aspirin: Secondary | ICD-10-CM | POA: Insufficient documentation

## 2019-10-29 DIAGNOSIS — M109 Gout, unspecified: Secondary | ICD-10-CM | POA: Insufficient documentation

## 2019-10-29 DIAGNOSIS — E7800 Pure hypercholesterolemia, unspecified: Secondary | ICD-10-CM | POA: Insufficient documentation

## 2019-10-29 DIAGNOSIS — R3915 Urgency of urination: Secondary | ICD-10-CM | POA: Insufficient documentation

## 2019-10-29 DIAGNOSIS — I10 Essential (primary) hypertension: Secondary | ICD-10-CM | POA: Diagnosis not present

## 2019-10-29 DIAGNOSIS — C61 Malignant neoplasm of prostate: Secondary | ICD-10-CM | POA: Insufficient documentation

## 2019-10-29 DIAGNOSIS — I2510 Atherosclerotic heart disease of native coronary artery without angina pectoris: Secondary | ICD-10-CM | POA: Diagnosis not present

## 2019-10-29 DIAGNOSIS — Z79899 Other long term (current) drug therapy: Secondary | ICD-10-CM | POA: Insufficient documentation

## 2019-10-29 HISTORY — PX: TRANSURETHRAL RESECTION OF BLADDER TUMOR: SHX2575

## 2019-10-29 LAB — HEMOGLOBIN A1C
Hgb A1c MFr Bld: 6.1 % — ABNORMAL HIGH (ref 4.8–5.6)
Mean Plasma Glucose: 128 mg/dL

## 2019-10-29 SURGERY — TURBT (TRANSURETHRAL RESECTION OF BLADDER TUMOR)
Anesthesia: General

## 2019-10-29 MED ORDER — SODIUM CHLORIDE 0.9% FLUSH: 3.0000 mL | INTRAVENOUS | Status: DC | PRN

## 2019-10-29 MED ORDER — PHENYLEPHRINE HCL-NACL 10-0.9 MG/250ML-% IV SOLN
INTRAVENOUS | Status: DC | PRN
  Administered 2019-10-29: 50 ug/min via INTRAVENOUS

## 2019-10-29 MED ORDER — FENTANYL CITRATE (PF) 100 MCG/2ML IJ SOLN
INTRAMUSCULAR | Status: DC | PRN
  Administered 2019-10-29 (×2): 50 ug via INTRAVENOUS

## 2019-10-29 MED ORDER — OXYCODONE HCL 5 MG PO TABS: 5.0000 mg | ORAL_TABLET | ORAL | Status: DC | PRN

## 2019-10-29 MED ORDER — LIDOCAINE 2% (20 MG/ML) 5 ML SYRINGE
INTRAMUSCULAR | Status: DC | PRN
  Administered 2019-10-29: 80 mg via INTRAVENOUS

## 2019-10-29 MED ORDER — FENTANYL CITRATE (PF) 100 MCG/2ML IJ SOLN
INTRAMUSCULAR | Status: AC
  Filled 2019-10-29: qty 2

## 2019-10-29 MED ORDER — PHENAZOPYRIDINE HCL 200 MG PO TABS
ORAL_TABLET | ORAL | Status: AC
  Filled 2019-10-29: qty 1

## 2019-10-29 MED ORDER — LIDOCAINE HCL URETHRAL/MUCOSAL 2 % EX GEL
CUTANEOUS | Status: AC
  Filled 2019-10-29: qty 30

## 2019-10-29 MED ORDER — EPHEDRINE 5 MG/ML INJ
INTRAVENOUS | Status: AC
  Filled 2019-10-29: qty 10

## 2019-10-29 MED ORDER — OXYCODONE HCL 5 MG PO TABS
ORAL_TABLET | ORAL | Status: AC
  Filled 2019-10-29: qty 1

## 2019-10-29 MED ORDER — PHENAZOPYRIDINE HCL 200 MG PO TABS: 200.0000 mg | ORAL_TABLET | Freq: Three times a day (TID) | ORAL | 1 refills | Status: DC | PRN

## 2019-10-29 MED ORDER — PHENAZOPYRIDINE HCL 200 MG PO TABS
200.0000 mg | ORAL_TABLET | Freq: Once | ORAL | Status: AC
  Administered 2019-10-29: 12:00:00 200 mg via ORAL

## 2019-10-29 MED ORDER — SODIUM CHLORIDE 0.9% FLUSH: 3.0000 mL | Freq: Two times a day (BID) | INTRAVENOUS | Status: DC

## 2019-10-29 MED ORDER — PROPOFOL 10 MG/ML IV BOLUS
INTRAVENOUS | Status: DC | PRN
  Administered 2019-10-29: 150 mg via INTRAVENOUS

## 2019-10-29 MED ORDER — OXYCODONE HCL 5 MG PO TABS
5.0000 mg | ORAL_TABLET | Freq: Once | ORAL | Status: AC | PRN
  Administered 2019-10-29: 5 mg via ORAL

## 2019-10-29 MED ORDER — LACTATED RINGERS IV SOLN: INTRAVENOUS | Status: DC | PRN

## 2019-10-29 MED ORDER — MORPHINE SULFATE (PF) 4 MG/ML IV SOLN: 2.0000 mg | INTRAVENOUS | Status: DC | PRN

## 2019-10-29 MED ORDER — LACTATED RINGERS IV SOLN: INTRAVENOUS | Status: DC

## 2019-10-29 MED ORDER — FENTANYL CITRATE (PF) 100 MCG/2ML IJ SOLN: 25.0000 ug | INTRAMUSCULAR | Status: DC | PRN

## 2019-10-29 MED ORDER — TRAMADOL HCL 50 MG PO TABS: 50.0000 mg | ORAL_TABLET | Freq: Four times a day (QID) | ORAL | 0 refills | Status: DC | PRN

## 2019-10-29 MED ORDER — DEXAMETHASONE SODIUM PHOSPHATE 10 MG/ML IJ SOLN
INTRAMUSCULAR | Status: DC | PRN
  Administered 2019-10-29: 10 mg via INTRAVENOUS

## 2019-10-29 MED ORDER — PHENYLEPHRINE HCL (PRESSORS) 10 MG/ML IV SOLN
INTRAVENOUS | Status: AC
  Filled 2019-10-29: qty 1

## 2019-10-29 MED ORDER — OXYCODONE HCL 5 MG/5ML PO SOLN: 5.0000 mg | Freq: Once | ORAL | Status: AC | PRN

## 2019-10-29 MED ORDER — SODIUM CHLORIDE 0.9 % IV SOLN: 250.0000 mL | INTRAVENOUS | Status: DC | PRN

## 2019-10-29 MED ORDER — ONDANSETRON HCL 4 MG/2ML IJ SOLN: 4.0000 mg | Freq: Once | INTRAMUSCULAR | Status: DC | PRN

## 2019-10-29 MED ORDER — ONDANSETRON HCL 4 MG/2ML IJ SOLN
INTRAMUSCULAR | Status: DC | PRN
  Administered 2019-10-29: 4 mg via INTRAVENOUS

## 2019-10-29 MED ORDER — SCOPOLAMINE 1 MG/3DAYS TD PT72
1.0000 | MEDICATED_PATCH | TRANSDERMAL | Status: DC
  Administered 2019-10-29: 1.5 mg via TRANSDERMAL
  Filled 2019-10-29: qty 1

## 2019-10-29 MED ORDER — SODIUM CHLORIDE 0.9 % IR SOLN
Status: DC | PRN
  Administered 2019-10-29: 6000 mL

## 2019-10-29 MED ORDER — DEXAMETHASONE SODIUM PHOSPHATE 10 MG/ML IJ SOLN
INTRAMUSCULAR | Status: AC
  Filled 2019-10-29: qty 1

## 2019-10-29 MED ORDER — ONDANSETRON HCL 4 MG/2ML IJ SOLN
INTRAMUSCULAR | Status: AC
  Filled 2019-10-29: qty 2

## 2019-10-29 MED ORDER — PROPOFOL 500 MG/50ML IV EMUL
INTRAVENOUS | Status: DC | PRN
  Administered 2019-10-29: 125 ug/kg/min via INTRAVENOUS

## 2019-10-29 MED ORDER — ACETAMINOPHEN 650 MG RE SUPP
650.0000 mg | RECTAL | Status: DC | PRN
  Filled 2019-10-29: qty 1

## 2019-10-29 MED ORDER — LIDOCAINE 2% (20 MG/ML) 5 ML SYRINGE
INTRAMUSCULAR | Status: AC
  Filled 2019-10-29: qty 5

## 2019-10-29 MED ORDER — ACETAMINOPHEN 325 MG PO TABS: 650.0000 mg | ORAL_TABLET | ORAL | Status: DC | PRN

## 2019-10-29 MED ORDER — CEFAZOLIN SODIUM-DEXTROSE 2-4 GM/100ML-% IV SOLN
2.0000 g | INTRAVENOUS | Status: AC
  Administered 2019-10-29: 11:00:00 2 g via INTRAVENOUS
  Filled 2019-10-29: qty 100

## 2019-10-29 SURGICAL SUPPLY — 22 items
BAG DRN RND TRDRP ANRFLXCHMBR (UROLOGICAL SUPPLIES)
BAG URINE DRAIN 2000ML AR STRL (UROLOGICAL SUPPLIES) IMPLANT
BAG URO CATCHER STRL LF (MISCELLANEOUS) ×3 IMPLANT
CATH FOLEY 2WAY SLVR  5CC 20FR (CATHETERS) ×2
CATH FOLEY 2WAY SLVR 5CC 20FR (CATHETERS) IMPLANT
CATH FOLEY 3WAY 30CC 22FR (CATHETERS) IMPLANT
CATH URET 5FR 28IN OPEN ENDED (CATHETERS) IMPLANT
GLOVE SURG SS PI 8.0 STRL IVOR (GLOVE) IMPLANT
GOWN STRL REUS W/TWL XL LVL3 (GOWN DISPOSABLE) ×3 IMPLANT
HOLDER FOLEY CATH W/STRAP (MISCELLANEOUS) IMPLANT
KIT TURNOVER KIT A (KITS) IMPLANT
LOOP CUT BIPOLAR 24F LRG (ELECTROSURGICAL) IMPLANT
MANIFOLD NEPTUNE II (INSTRUMENTS) ×3 IMPLANT
PACK CYSTO (CUSTOM PROCEDURE TRAY) ×3 IMPLANT
PENCIL SMOKE EVACUATOR (MISCELLANEOUS) IMPLANT
SUT ETHILON 3 0 PS 1 (SUTURE) IMPLANT
SYR 10ML LL (SYRINGE) ×2 IMPLANT
SYR 30ML LL (SYRINGE) IMPLANT
SYRINGE IRR TOOMEY STRL 70CC (SYRINGE) IMPLANT
TUBING CONNECTING 10 (TUBING) ×2 IMPLANT
TUBING CONNECTING 10' (TUBING) ×1
TUBING UROLOGY SET (TUBING) ×3 IMPLANT

## 2019-10-29 NOTE — Anesthesia Postprocedure Evaluation (Signed)
Anesthesia Post Note  Patient: Evan Mitchell  Procedure(s) Performed: TRANSURETHRAL RESECTION OF BLADDER TUMOR (TURBT)  INSTILL GEMCITABINE (N/A )     Patient location during evaluation: PACU Anesthesia Type: General Level of consciousness: awake and alert Pain management: pain level controlled Vital Signs Assessment: post-procedure vital signs reviewed and stable Respiratory status: spontaneous breathing, nonlabored ventilation and respiratory function stable Cardiovascular status: blood pressure returned to baseline and stable Postop Assessment: no apparent nausea or vomiting Anesthetic complications: no    Last Vitals:  Vitals:   10/29/19 1230 10/29/19 1300  BP:  (!) 195/98  Pulse:  96  Resp:  16  Temp: 36.7 C 36.7 C  SpO2:  100%    Last Pain:  Vitals:   10/29/19 1300  TempSrc: Oral  PainSc:                  Lidia Collum

## 2019-10-29 NOTE — Anesthesia Preprocedure Evaluation (Addendum)
Anesthesia Evaluation  Patient identified by MRN, date of birth, ID band Patient awake    Reviewed: Allergy & Precautions, NPO status , Patient's Chart, lab work & pertinent test results, reviewed documented beta blocker date and time   History of Anesthesia Complications (+) PONVNegative for: history of anesthetic complications  Airway Mallampati: II  TM Distance: >3 FB Neck ROM: Full    Dental   Pulmonary neg pulmonary ROS, former smoker,    Pulmonary exam normal        Cardiovascular hypertension, Pt. on medications and Pt. on home beta blockers Normal cardiovascular exam     Neuro/Psych negative neurological ROS  negative psych ROS   GI/Hepatic negative GI ROS, Neg liver ROS,   Endo/Other  negative endocrine ROS  Renal/GU negative Renal ROS   Bladder tumor    Musculoskeletal negative musculoskeletal ROS (+)   Abdominal   Peds  Hematology negative hematology ROS (+)   Anesthesia Other Findings H/o LBBB, unremarkable echo and stress test in 2014  Reproductive/Obstetrics                            Anesthesia Physical Anesthesia Plan  ASA: II  Anesthesia Plan: General   Post-op Pain Management:    Induction: Intravenous  PONV Risk Score and Plan: 4 or greater and Ondansetron, Dexamethasone, Treatment may vary due to age or medical condition, Propofol infusion, TIVA and Scopolamine patch - Pre-op  Airway Management Planned: LMA  Additional Equipment: None  Intra-op Plan:   Post-operative Plan: Extubation in OR  Informed Consent: I have reviewed the patients History and Physical, chart, labs and discussed the procedure including the risks, benefits and alternatives for the proposed anesthesia with the patient or authorized representative who has indicated his/her understanding and acceptance.     Dental advisory given  Plan Discussed with:   Anesthesia Plan Comments:         Anesthesia Quick Evaluation

## 2019-10-29 NOTE — Discharge Instructions (Addendum)
Transurethral Resection of Bladder Tumor, Care After This sheet gives you information about how to care for yourself after your procedure. Your health care provider may also give you more specific instructions. If you have problems or questions, contact your health care provider. What can I expect after the procedure? After the procedure, it is common to have:  A small amount of blood in your urine for up to 2 weeks.  Soreness or mild pain from your catheter. After your catheter is removed, you may have mild soreness, especially when urinating.  Pain in your lower abdomen. Follow these instructions at home: Medicines   Take over-the-counter and prescription medicines only as told by your health care provider.  If you were prescribed an antibiotic medicine, take it as told by your health care provider. Do not stop taking the antibiotic even if you start to feel better.  Do not drive for 24 hours if you were given a sedative during your procedure.  Ask your health care provider if the medicine prescribed to you: ? Requires you to avoid driving or using heavy machinery. ? Can cause constipation. You may need to take these actions to prevent or treat constipation:  Take over-the-counter or prescription medicines.  Eat foods that are high in fiber, such as beans, whole grains, and fresh fruits and vegetables.  Limit foods that are high in fat and processed sugars, such as fried or sweet foods. Activity  Return to your normal activities as told by your health care provider. Ask your health care provider what activities are safe for you.  Do not lift anything that is heavier than 10 lb (4.5 kg), or the limit that you are told, until your health care provider says that it is safe.  Avoid intense physical activity for as long as told by your health care provider.  Rest as told by your health care provider.  Avoid sitting for a long time without moving. Get up to take short walks every  1-2 hours. This is important to improve blood flow and breathing. Ask for help if you feel weak or unsteady. General instructions   Do not drink alcohol for as long as told by your health care provider. This is especially important if you are taking prescription pain medicines.  Do not take baths, swim, or use a hot tub until your health care provider approves. Ask your health care provider if you may take showers. You may only be allowed to take sponge baths.  If you have a catheter, follow instructions from your health care provider about caring for your catheter and your drainage bag.  Drink enough fluid to keep your urine pale yellow.  Wear compression stockings as told by your health care provider. These stockings help to prevent blood clots and reduce swelling in your legs.  Keep all follow-up visits as told by your health care provider. This is important. ? You will need to be followed closely with regular checks of your bladder and urethra (cystoscopies) to make sure that the cancer does not come back. Contact a health care provider if:  You have pain that gets worse or does not improve with medicine.  You have blood in your urine for more than 2 weeks.  You have cloudy or bad-smelling urine.  You become constipated. Signs of constipation may include having: ? Fewer than three bowel movements in a week. ? Difficulty having a bowel movement. ? Stools that are dry, hard, or larger than normal.  You have  a fever. Get help right away if:  You have: ? Severe pain. ? Bright red blood in your urine. ? Blood clots in your urine. ? A lot of blood in your urine.  Your catheter has been removed and you are not able to urinate.  You have a catheter in place and the catheter is not draining urine. Summary  After your procedure, it is common to have a small amount of blood in your urine, soreness or mild pain from your catheter, and pain in your lower abdomen.  Take  over-the-counter and prescription medicines only as told by your health care provider.  Rest as told by your health care provider. Follow your health care provider's instructions about returning to normal activities. Ask what activities are safe for you.  If you have a catheter, follow instructions from your health care provider about caring for your catheter and your drainage bag.  Get help right away if you cannot urinate, you have severe pain, or you have bright red blood or blood clots in your urine.  You may remove the foley catheter in the morning by cutting off the side arm from the catheter and the catheter should just slide out.   This information is not intended to replace advice given to you by your health care provider. Make sure you discuss any questions you have with your health care provider. Document Revised: 04/05/2018 Document Reviewed: 04/05/2018 Elsevier Patient Education  [place].

## 2019-10-29 NOTE — Op Note (Signed)
Procedure: Cystoscopy with transurethral resection of a 3 cm bladder tumor and right diagnostic ureteroscopy.  Preop diagnosis: Bladder tumor of the right trigone.  Postop diagnosis: Same.  Surgeon: Dr. Irine Seal.  Anesthesia: General.  Specimen: Bladder tumor chips.  Drains: 20 [place] Foley catheter.  EBL: None.  Complications: None.  Indications: The patient is a 74 year old white male who was found on a recent CT scan done for staging of prostate cancer to have a lesion at the bladder base that is worrisome for neoplasm.  Office cystoscopy confirmed the presence of a papillary tumor of the right trigone and bladder neck and it was felt that TURBT was indicated with consideration of gemcitabine instillation.  Procedure: He was given 2 g of Ancef.  He was taken the operating room where general anesthetic was induced.  He was placed in lithotomy position and fitted with PAS hose.  His perineum and genitalia were prepped with Betadine solution he was draped in usual sterile fashion.  Cystoscopy was performed using a 23 [place] scope and 30 degree lens.  Examination revealed a normal urethra.  The external sphincter was intact.  The prostatic urethra was approximately 3 cm in length with bilobar hyperplasia with some degree of obstruction.  Examination of bladder revealed moderate trabeculation.  The left ureteral orifice was unremarkable.  The right ureteral orifice was not visible because of the overlying papillary tumor that extended from the right bladder neck to the mid trigone lateral to the area of the orifice with a diameter of approximately 3 cm.  The cystoscope was then replaced with a continuous-flow resectoscope sheath which was fitted with the [facility] handle and a 30 degree lens.  Saline was used the irrigant.  Resection of the tumor was performed down into the muscle and the right ureteral orifice required resection to complete the removal of the tumor.  Once the tumor had been  resected the peripheral mucosa was fulgurated to ensure a margin.  The specimen was then removed.  Hemostasis was confirmed.  The resectoscope was removed and a 6-1/2 [place] semirigid ureteroscope was then passed per urethra and up the right ureteral orifice into the distal ureter to inspect for the presence of tumor in this area.  No mucosal abnormalities were noted.  It was not felt that the stent was indicated but it was felt that with the ureteral resection gemcitabine instillation would not be prudent.  Ureteroscope was then removed and a 20 [place] Foley catheter was inserted.  The balloon was filled with 10 cc of sterile fluid.  The catheter was placed to straight drainage.  He was taken down from lithotomy position, his anesthetic was reversed and he was moved recovery in stable condition.  There were no complications.

## 2019-10-29 NOTE — Progress Notes (Signed)
Did detailed discharge and catheter teaching with patient and wife

## 2019-10-29 NOTE — Anesthesia Procedure Notes (Signed)
Procedure Name: LMA Insertion Date/Time: 10/29/2019 10:30 AM Performed by: Mitzie Na, CRNA Pre-anesthesia Checklist: Patient identified, Emergency Drugs available, Suction available and Patient being monitored Patient Re-evaluated:Patient Re-evaluated prior to induction Oxygen Delivery Method: Circle system utilized Preoxygenation: Pre-oxygenation with 100% oxygen Induction Type: IV induction LMA: LMA inserted LMA Size: 5.0 Number of attempts: 1 Airway Equipment and Method: Oral airway Placement Confirmation: positive ETCO2 and breath sounds checked- equal and bilateral Tube secured with: Tape Dental Injury: Teeth and Oropharynx as per pre-operative assessment

## 2019-10-29 NOTE — Transfer of Care (Signed)
Immediate Anesthesia Transfer of Care Note  Patient: Evan Mitchell  Procedure(s) Performed: TRANSURETHRAL RESECTION OF BLADDER TUMOR (TURBT)  INSTILL GEMCITABINE (N/A )  Patient Location: PACU  Anesthesia Type:General  Level of Consciousness: awake, alert , oriented and patient cooperative  Airway & Oxygen Therapy: Patient Spontanous Breathing and Patient connected to face mask oxygen  Post-op Assessment: Report given to RN, Post -op Vital signs reviewed and stable and Patient moving all extremities  Post vital signs: Reviewed and stable  Last Vitals:  Vitals Value Taken Time  BP 159/137 10/29/19 1109  Temp    Pulse 75 10/29/19 1111  Resp 21 10/29/19 1111  SpO2 100 % 10/29/19 1111  Vitals shown include unvalidated device data.  Last Pain:  Vitals:   10/29/19 0807  TempSrc:   PainSc: 0-No pain         Complications: No apparent anesthesia complications

## 2019-10-29 NOTE — Interval H&P Note (Signed)
History and Physical Interval Note:  10/29/2019 10:17 AM  Evan Mitchell  has presented today for surgery, with the diagnosis of BLADDER TUMOR.  The various methods of treatment have been discussed with the patient and family. After consideration of risks, benefits and other options for treatment, the patient has consented to  Procedure(s): TRANSURETHRAL RESECTION OF BLADDER TUMOR (TURBT)  INSTILL GEMCITABINE (N/A) as a surgical intervention.  The patient's history has been reviewed, patient examined, no change in status, stable for surgery.  I have reviewed the patient's chart and labs.  Questions were answered to the patient's satisfaction.     Irine Seal

## 2019-10-29 NOTE — H&P (Signed)
CC/HPI: I have prostate cancer.     Evan Mitchell returns today in f/u to discuss his prostate biopsy and staging study as well as for cystoscopy to assess a possible neoplasm seen on CT at the bladder neck. He had a prostate biopsy for a right mid lateral nodule and a PSA of 11.3.   Prostate volume: 65ml   Path T2a N0 M0 Gleason 7(4+3) unfavorable intermediate risk prostate cancer.   GG1 in 14% of left medial apical core.  GG2 in 6% of right medial apical core, 7% of left mid lateral core and 36% of the left lateral apical core.  GG3 in 3% of left mid medial core, 5% of right mid lateral core and 33% of right lateral apical core.   CT AP indeterminate lesion of right posterior lateral wall, cysto recommended. O/W no mets.   Bone scan inderminate lesion of the mid thoracic spine and other sites consistent with degenerative disease. CT Chest recommmende.   CT Chest: probable small bone island at T11. Degenerative disease of the thoracic spine and 3 vessel CAD.   [facility] predicts 17% OCD, 80% ECE, 27% LNI and 27% SVI.    Comorbidities include HTN, Hypercholesterolemia, Gout and Arthritis. The Chest CT shows 3 vessel CAD but he has no symptoms.    ALLERGIES: codeine    MEDICATIONS: Allopurinol 300 mg tablet  Hydrochlorothiazide 25 mg tablet  Lisinopril 40 mg tablet  Simvastatin 40 mg tablet  Airborne Elderberry Gummy  Aspirin Ec 81 mg tablet, delayed release  Calcium Ascorbate  Carvedilol 25 mg tablet  Glucosamine & Chondroitin  Levofloxacin 750 mg tablet 1 po 1 hour prior to biopsy  Loratadine 10 mg tablet  Melatonin 3 mg tablet  Omega-3 Fish Oil  Preservision Areds  Tylenol  Vitamin C     GU PSH: Locm 300-399Mg /Ml Iodine,1Ml - 10/10/2019 Prostate Needle Biopsy - 09/18/2019       PSH Notes: LIH x 2.    NON-GU PSH: Hernia Repair Surgical Pathology, Gross And Microscopic Examination For Prostate Needle - 09/18/2019     GU PMH: Elevated PSA - 09/18/2019, he has a slowly  rising PSA with a right prostate nodule and will be set up for a prostate [place] and biopsy. I have reviewed the risks of bleeding, infection and voiding difficulty. Levaquin sent for the prep. , - 08/07/2019 Prostate nodule w/ LUTS - 08/07/2019    NON-GU PMH: Anxiety Arthritis Gout Hypercholesterolemia Hypertension    FAMILY HISTORY: Heart Attack - Mother   SOCIAL HISTORY: Marital Status: Married Preferred Language: English; Race: White Current Smoking Status: Patient does not smoke anymore. Has not smoked since 07/21/1979. Smoked for 20 years. Smoked 2 packs per day.   Tobacco Use Assessment Completed: Used Tobacco in last 30 days? Light Drinker.  Drinks 3 caffeinated drinks per day. 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     Notes: 2 daughters    REVIEW OF SYSTEMS:    GU Review Male:   Patient denies frequent urination, hard to postpone urination, burning/ pain with urination, get up at night to urinate, leakage of urine, stream starts and stops, trouble starting your stream, have to strain to urinate , erection problems, and penile pain.  Gastrointestinal (Upper):   Patient denies nausea, vomiting, and indigestion/ heartburn.  Gastrointestinal (Lower):   Patient denies diarrhea and constipation.  Constitutional:   Patient denies fever, night sweats, weight loss, and fatigue.  Skin:   Patient denies skin rash/ lesion and itching.  Eyes:  Patient denies blurred vision and double vision.  Ears/ Nose/ Throat:   Patient denies sore throat and sinus problems.  Hematologic/Lymphatic:   Patient denies swollen glands and easy bruising.  Cardiovascular:   Patient denies chest pains and leg swelling.  Respiratory:   Patient denies cough and shortness of breath.  Endocrine:   Patient denies excessive thirst.  Musculoskeletal:   Patient denies back pain and joint pain.  Neurological:   Patient denies headaches and dizziness.  Psychologic:   Patient denies depression and anxiety.    VITAL SIGNS:      10/23/2019 03:48 PM  BP 180/86 mmHg  Pulse 78 /min  Temperature 97.8 F / 36.5 C   PAST DATA REVIEWED:  Source Of History:  Patient  Lab Test Review:   BUN/Creatinine  Records Review:   Pathology Reports, Previous Patient Records  Urine Test Review:   Urinalysis  X-Ray Review: C.T. Chest: Reviewed Films. Reviewed Report. Discussed With Patient.  C.T. Abdomen/Pelvis: Reviewed Films. Reviewed Report. Discussed With Patient.  Bone Scan: Reviewed Report. Discussed With Patient.     07/11/19 03/29/19 09/21/18 07/06/18 03/14/18 08/17/17 02/09/17 07/13/16  PSA  Total PSA 11.3 ng/dl 11.867 ng/dl 9.2 ng/dl 10.1 ng/dl 10.305 ng/dl 9.626 ng/dl 7.44 ng/dl 8.3 ng/dl    PROCEDURES:         Flexible Cystoscopy - 52000  Risks, benefits, and some of the potential complications of the procedure were discussed. 26ml of 2% lidocaine jelly was instilled intraurethrally.  Cipro 500mg  given for antibiotic prophylaxis.     Meatus:  Normal size. Normal location. Normal condition.  Urethra:  No strictures.  External Sphincter:  Normal.  Verumontanum:  Normal.  Prostate:  Obstructing. Moderate hyperplasia.  Bladder Neck:  Non-obstructing.  Ureteral Orifices:  Normal location. Normal size. Normal shape. Effluxed clear urine.  Bladder:  Mild trabeculation. A trigone tumor. 2 cm tumor. Solitary tumor. Normal mucosa. No stones.      The procedure was well tolerated and there were no complications.         Urinalysis Dipstick Dipstick Cont'd  Color: Yellow Bilirubin: Neg mg/dL  Appearance: Clear Ketones: Neg mg/dL  Specific Gravity: 1.020 Blood: Neg ery/uL  pH: 5.5 Protein: Trace mg/dL  Glucose: Neg mg/dL Urobilinogen: 0.2 mg/dL    Nitrites: Neg    Leukocyte Esterase: Neg leu/uL    ASSESSMENT:      ICD-10 Details  1 GU:   Prostate Cancer - C61 Acute, Life Threatening - T2a N0 M0 GG3 unfavorable intermediate risk prostate cancer. At 9 I lean toward Adjuvant ADT with EXRT but  with the bladder tumor, unless it proves to be prostatic in origin, I will have him seen in the [place] to further review the option.   2   Prostate nodule w/ LUTS - N40.3 Chronic, Stable  3   Elevated PSA - R97.20 Chronic, Stable  4   Urinary Urgency - R39.15 Chronic, Stable  5   Bladder Cancer Trigone - C67.0 Undiagnosed New Problem - He has a 2cm papillary lesion at the mid trigone between the UO's. he is going to need a TURBT with Gemcitabine before we initiate definitive therapy for the prostate cancer. I reviewed the risks of bleeding, infection, bladder and ureteral injury, chemical cystitis, thrombotic events and anesthetic complications.      PLAN:           Schedule Return Visit/Planned Activity: ASAP - Schedule Surgery             Note:  TURBT  Return Visit/Planned Activity: Next Available Appointment - Consult             Note: MDC. I will contact Dr. Alinda Money.  Procedure: Unspecified Date - Bladder Instill AntiCA Agent [phone] Notes: Next available Gemcitabine with TURBT  Procedure: Unspecified Date - Cystoscopy TURBT 2-5 cm - 52235 Notes: Next available.

## 2019-10-30 LAB — SURGICAL PATHOLOGY

## 2019-11-07 ENCOUNTER — Encounter: Payer: Self-pay | Admitting: Medical Oncology

## 2019-11-09 ENCOUNTER — Ambulatory Visit: Payer: Medicare Other | Attending: Internal Medicine

## 2019-11-09 DIAGNOSIS — Z23 Encounter for immunization: Secondary | ICD-10-CM | POA: Insufficient documentation

## 2019-11-09 NOTE — Progress Notes (Signed)
   Covid-19 Vaccination Clinic  Name:  Evan Mitchell    MRN: [id number] DOB: [birth year]  11/09/2019  [name] was observed post Covid-19 immunization for 15 minutes without incidence. He was provided with Vaccine Information Sheet and instruction to access the V-Safe system.   [name] was instructed to call 911 with any severe reactions post vaccine: Marland Kitchen Difficulty breathing  . Swelling of your face and throat  . A fast heartbeat  . A bad rash all over your body  . Dizziness and weakness    Immunizations Administered    Name Date Dose VIS Date Route   Pfizer COVID-19 Vaccine 11/09/2019  8:26 AM 0.3 mL 08/30/2019 Intramuscular   Manufacturer: [place]   Lot: [id number]   [place]: [id number]

## 2019-11-11 ENCOUNTER — Encounter: Payer: Self-pay | Admitting: Radiation Oncology

## 2019-11-11 ENCOUNTER — Telehealth: Payer: Self-pay | Admitting: Medical Oncology

## 2019-11-11 DIAGNOSIS — C670 Malignant neoplasm of trigone of bladder: Secondary | ICD-10-CM | POA: Diagnosis not present

## 2019-11-11 DIAGNOSIS — R3915 Urgency of urination: Secondary | ICD-10-CM | POA: Diagnosis not present

## 2019-11-11 DIAGNOSIS — C61 Malignant neoplasm of prostate: Secondary | ICD-10-CM | POA: Diagnosis not present

## 2019-11-11 DIAGNOSIS — N403 Nodular prostate with lower urinary tract symptoms: Secondary | ICD-10-CM | POA: Diagnosis not present

## 2019-11-11 NOTE — Progress Notes (Signed)
GU Location of Tumor / Histology: prostatic adenocarcinoma  If Prostate Cancer, Gleason Score is (4 + 3) and PSA is (11.3). Prostate volume 39.  Evan Mitchell was scanned in work up for presumed prostate ca and a bladder tumor was incidentally found. Unfortunately, patient has two primary cancers and has been referred to [facility].   Biopsies of prostate (if applicable) revealed:    Past/Anticipated interventions by urology, if any: prostate biopsy, CT abd/pelvis (negative), bone scan (degenerative changes), TURBT with gemcitabine (10/29/19), referral to [facility] (leaning toward adjuvant ADT with EXRT but with bladder tumor questionable)  Past/Anticipated interventions by medical oncology, if any: no  Weight changes, if any: no   Bowel/Bladder complaints, if any: Denies any urinary symptoms. Patient did not bring IPSS and SHIM to encounter. Provided patient with new forms and requested he complete while here. Denies dysuria, hematuria, urinary leakage or incontinence.    Nausea/Vomiting, if any: no  Pain issues, if any:  denies  SAFETY ISSUES:  Prior radiation? denies  Pacemaker/ICD? Denies.  Possible current pregnancy? no, male patient  Is the patient on methotrexate? no  Current Complaints / other details:  74 year old male. 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

## 2019-11-11 NOTE — Telephone Encounter (Signed)
Spoke with patient's wife to confirm appointment for [facility] 3/12, arriving at 12:30 pm. I reviewed the location, valet parking COVID restrictions and registration. I reminded him to bring completed medical forms. She voiced understanding.

## 2019-11-12 ENCOUNTER — Encounter: Payer: Self-pay | Admitting: General Practice

## 2019-11-12 ENCOUNTER — Ambulatory Visit
Admission: RE | Admit: 2019-11-12 | Discharge: 2019-11-12 | Disposition: A | Discharge status: Home / Self Care | Payer: Medicare Other | Source: Ambulatory Visit | Attending: Radiation Oncology | Admitting: Radiation Oncology

## 2019-11-12 ENCOUNTER — Other Ambulatory Visit: Payer: Self-pay

## 2019-11-12 ENCOUNTER — Encounter: Payer: Self-pay | Admitting: Radiation Oncology

## 2019-11-12 ENCOUNTER — Encounter: Payer: Self-pay | Admitting: Medical Oncology

## 2019-11-12 ENCOUNTER — Inpatient Hospital Stay: Payer: Medicare Other | Attending: Oncology | Admitting: Oncology

## 2019-11-12 VITALS — BP 167/80 | HR 81 | Temp 98.3°F | Resp 20 | Wt 214.8 lb

## 2019-11-12 DIAGNOSIS — C61 Malignant neoplasm of prostate: Secondary | ICD-10-CM | POA: Insufficient documentation

## 2019-11-12 DIAGNOSIS — R972 Elevated prostate specific antigen [PSA]: Secondary | ICD-10-CM | POA: Diagnosis not present

## 2019-11-12 DIAGNOSIS — I447 Left bundle-branch block, unspecified: Secondary | ICD-10-CM | POA: Insufficient documentation

## 2019-11-12 DIAGNOSIS — K4090 Unilateral inguinal hernia, without obstruction or gangrene, not specified as recurrent: Secondary | ICD-10-CM | POA: Insufficient documentation

## 2019-11-12 DIAGNOSIS — I10 Essential (primary) hypertension: Secondary | ICD-10-CM | POA: Diagnosis not present

## 2019-11-12 DIAGNOSIS — Z85828 Personal history of other malignant neoplasm of skin: Secondary | ICD-10-CM | POA: Diagnosis not present

## 2019-11-12 DIAGNOSIS — F419 Anxiety disorder, unspecified: Secondary | ICD-10-CM | POA: Diagnosis not present

## 2019-11-12 DIAGNOSIS — C670 Malignant neoplasm of trigone of bladder: Secondary | ICD-10-CM | POA: Insufficient documentation

## 2019-11-12 DIAGNOSIS — Z79899 Other long term (current) drug therapy: Secondary | ICD-10-CM | POA: Diagnosis not present

## 2019-11-12 DIAGNOSIS — Z809 Family history of malignant neoplasm, unspecified: Secondary | ICD-10-CM | POA: Diagnosis not present

## 2019-11-12 DIAGNOSIS — Z87891 Personal history of nicotine dependence: Secondary | ICD-10-CM | POA: Insufficient documentation

## 2019-11-12 DIAGNOSIS — E781 Pure hyperglyceridemia: Secondary | ICD-10-CM | POA: Diagnosis not present

## 2019-11-12 DIAGNOSIS — Z7982 Long term (current) use of aspirin: Secondary | ICD-10-CM | POA: Insufficient documentation

## 2019-11-12 DIAGNOSIS — Z808 Family history of malignant neoplasm of other organs or systems: Secondary | ICD-10-CM | POA: Diagnosis not present

## 2019-11-12 NOTE — Progress Notes (Signed)
[place] Psychosocial Distress Screening Spiritual Care  Met with Evan "Mikki Santee" and his wife Evan Mitchell in [facility] to introduce [facility] [facility] team/resources, reviewing distress screen per protocol.  The patient scored a 0 on the Psychosocial Distress Thermometer which indicates minimal distress. Also assessed for distress and other psychosocial needs.   ONCBCN DISTRESS SCREENING 11/12/2019  Screening Type Initial Screening  Distress experienced in past week (1-10) 0  Referral to support programs Yes   Mikki Santee was radiantly joyful and uses faith, prayer, gratitude, and perspective to cope and make meaning. 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 Mikki Santee and Evan Mitchell report brief anxiety upon cancer diagnoses, but overall are so relieved that they feel minimal distress now. We enjoyed a time of reflective meaning-making conversation and prayer.  Follow up needed: No. No other needs at this time, but couple is aware of ongoing [place] team and programming availability as needed/desired.   [place], [place], [facility] Pager [phone] Voicemail [phone]

## 2019-11-12 NOTE — Progress Notes (Signed)
                               Care Plan Summary  Name: Evan Mitchell. Gago DOB: 1946/07/24   Your Medical Team:   Urologist -  Dr. Raynelle Bring, [facility]  Radiation Oncologist - Dr. Tyler Pita, [facility]   Medical Oncologist - Dr. Zola Button, [place]  Recommendations: 1)  Androgen deprivation (hormone therapy)- 6 months 2)  Radiation   * These recommendations are based on information available as of today's consult.      Recommendations may change depending on the results of further tests or exams.  Next Steps: 1) Dr. Ralene [place]rat office will schedule hormone injection   When appointments need to be scheduled, you will be contacted by [facility] and/or [facility].  Questions?  Please do not hesitate to call [name], RN, BSN, OCN at [phone] any questions or concerns.  Evan Mitchell is your Oncology Nurse Navigator and is available to assist you while you're receiving your medical care at [facility].

## 2019-11-12 NOTE — Progress Notes (Signed)
Reason for the request: Prostate cancer  HPI: I was asked by Dr. Jeffie Pollock to evaluate Evan Mitchell for the evaluation of prostate cancer.  He is a 74 year old man with history of hypertension and hyperlipidemia was found to have elevated PSA in November 2020.  At that time was evaluated by Dr. Jeffie Pollock and a PSA was 11.3.  He underwent a digital rectal examination at that time which was found to have a right prostate nodule that is suspicious for malignancy.  Prostate biopsy obtained on September 18, 2019 by Dr. Jeffie Pollock showed prostate cancer with a Gleason score of 4+3 equal 7 in 2 cores on the right.  A core of 4+3 equal 7 noted on the left.  3 other cores were 3+4 equal 7 noted.  Based on these findings he underwent staging work-up including a bone scan which showed an intermediate lesion in the mid thoracic spine.  CT scan abdomen pelvis as well as subsequently CT scan of the chest were obtained to complete the staging.  His CT scan of the chest showed no evidence of metastatic disease in the chest and the tiny sclerotic focus at T11 likely represent bone islands and less likely metastatic disease.  CT scan abdomen pelvis showed a bladder lesion in the right posterior lateral wall but no evidence of metastatic disease otherwise.  He has subsequently underwent cystoscopy and TURBT on February 9 and a bladder tumor of the right trigone was noted.  The final pathology showed noninvasive high-grade papillary urothelial carcinoma without muscle invasion.  He is scheduled to have induction BCG in the near future.  He was referred to the prostate cancer multidisciplinary clinic to discuss treatment options.  Clinically, he reports no complaints at this time.  He denies any urinary urgency or frequency.  He denies any hematuria or dysuria.  He does not report any headaches, blurry vision, syncope or seizures. Does not report any fevers, chills or sweats.  Does not report any cough, wheezing or hemoptysis.  Does not  report any chest pain, palpitation, orthopnea or leg edema.  Does not report any nausea, vomiting or abdominal pain.  Does not report any constipation or diarrhea.  Does not report any skeletal complaints.    Does not report frequency, urgency or hematuria.  Does not report any skin rashes or lesions. Does not report any heat or cold intolerance.  Does not report any lymphadenopathy or petechiae.  Does not report any anxiety or depression.  Remaining review of systems is negative.    Past Medical History:  Diagnosis Date  . Allergic rhinitis   . Anxiety   . Arthritis    hand and feet  . History of syncope 10/2017  . HTN (hypertension)   . Hypertriglyceridemia    Mild  . Impaired fasting glucose   . Inguinal hernia    Left  . LBBB (left bundle branch block) 12/12/2012  . PONV (postoperative nausea and vomiting)    nausea,dizziness after anesthesia  . Pre-diabetes    control with diet and exercise  . Prostate cancer ([place])   . Skin cancer 2018   basal and squamous cell carcinoma removed and chemotherapy cream applied.  :  Past Surgical History:  Procedure Laterality Date  . COLONOSCOPY    . HERNIA REPAIR  1995  . INGUINAL HERNIA REPAIR Left 02/28/2019   Procedure: LEFT INGUINAL HERNIA REPAIR WITH MESH;  Surgeon: Coralie Keens, MD;  Location: [place];  Service: General;  Laterality: Left;  . TONSILLECTOMY  1947  . TRANSURETHRAL RESECTION OF BLADDER TUMOR N/A 10/29/2019   Procedure: TRANSURETHRAL RESECTION OF BLADDER TUMOR (TURBT)  INSTILL GEMCITABINE;  Surgeon: Irine Seal, MD;  Location: [facility];  Service: Urology;  Laterality: N/A;  :   Current Outpatient Medications:  .  acetaminophen (TYLENOL) 650 MG CR tablet, Take 1,300 mg by mouth at bedtime as needed for pain., Disp: , Rfl:  .  allopurinol (ZYLOPRIM) 300 MG tablet, Take 300 mg by mouth daily. , Disp: , Rfl:  .  aspirin EC 81 MG tablet, Take 81 mg by mouth daily., Disp: , Rfl:  .  Calcium  Carbonate-Vitamin D (CALCIUM + D PO), Take 2 tablets by mouth daily with breakfast. , Disp: , Rfl:  .  carvedilol (COREG) 25 MG tablet, Take 1 tablet (25 mg total) by mouth 2 (two) times daily with a meal., Disp: 180 tablet, Rfl: 0 .  GLUCOSAMINE-CHONDROITIN PO, Take 1 tablet by mouth 3 (three) times daily with meals. , Disp: , Rfl:  .  hydrochlorothiazide (HYDRODIURIL) 25 MG tablet, TAKE 1 TABLET (25 MG TOTAL) BY MOUTH DAILY. (Patient taking differently: Take 25 mg by mouth daily. ), Disp: 90 tablet, Rfl: 2 .  levofloxacin (LEVAQUIN) 750 MG tablet, TK 1 T PO 1 HOUR PRIOR TO BIOPSY, Disp: , Rfl:  .  lisinopril (PRINIVIL,ZESTRIL) 40 MG tablet, Take 40 mg by mouth daily., Disp: , Rfl:  .  Melatonin 3 MG TABS, Take 6 mg by mouth at bedtime., Disp: , Rfl:  .  Multiple Vitamins-Minerals (PRESERVISION AREDS PO), Take 1 tablet by mouth 2 (two) times daily. Breakfast & supper, Disp: , Rfl:  .  Omega-3 Fatty Acids (FISH OIL PO), Take 1 tablet by mouth 3 (three) times daily with meals. , Disp: , Rfl:  .  phenazopyridine (PYRIDIUM) 200 MG tablet, Take 1 tablet (200 mg total) by mouth 3 (three) times daily as needed for pain., Disp: 10 tablet, Rfl: 1 .  prednisoLONE acetate (PRED FORTE) 1 % ophthalmic suspension, Place 1 drop into the left eye 3 (three) times daily., Disp: , Rfl:  .  simvastatin (ZOCOR) 40 MG tablet, Take 40 mg by mouth daily with supper. , Disp: , Rfl:  .  SYSTANE ULTRA 0.4-0.3 % SOLN, Place 1 drop into both eyes 3 (three) times daily as needed for dry eyes., Disp: , Rfl:  .  traMADol (ULTRAM) 50 MG tablet, Take 1 tablet (50 mg total) by mouth every 6 (six) hours as needed., Disp: 8 tablet, Rfl: 0 .  vitamin C (ASCORBIC ACID) 500 MG tablet, Take 500 mg by mouth daily with breakfast. , Disp: , Rfl:  No current facility-administered medications for this visit.  Facility-Administered Medications Ordered in Other Visits:  .  gemcitabine (GEMZAR) chemo syringe for bladder instillation 2,000 mg,  2,000 mg, Bladder Instillation, Once, Irine Seal, MD:  Allergies  Allergen Reactions  . Aleve [Naproxen Sodium] Swelling    Fluid retention  . Codeine     nausea  :  Family History  Problem Relation Age of Onset  . Heart attack Mother   . Skin cancer Mother   . Diabetes Mother   . Hypertension Mother   . Melanoma Mother   . Cancer Maternal Grandfather        throat/lung cancer  . Melanoma Sister   . Melanoma Sister   . Prostate cancer Neg Hx   . Colon cancer Neg Hx   . Breast cancer Neg Hx   . Pancreatic cancer Neg Hx   :  Social History   Socioeconomic History  . Marital status: Married    Spouse name: Evan Mitchell  . Number of children: 2  . Years of education: Not on file  . Highest education level: Not on file  Occupational History  . Occupation: custodian    Comment: retired-part time building maintenance  Tobacco Use  . Smoking status: Former Smoker    Packs/day: 2.00    Years: 20.00    Pack years: 40.00    Types: Cigarettes    Quit date: 09/19/1976    Years since quitting: 43.1  . Smokeless tobacco: Never Used  Substance and Sexual Activity  . Alcohol use: No    Alcohol/week: 0.0 standard drinks  . Drug use: No  . Sexual activity: Not Currently  Other Topics Concern  . Not on file  Social History Narrative   Daughter, Stan Head, lives in [place] and works as an Glass blower/designer.  Daughter, Mary Sella, lives in [place] and works as a Marine scientist.   Social Determinants of Health   Financial Resource Strain:   . Difficulty of Paying Living Expenses: Not on file  Food Insecurity:   . Worried About Charity fundraiser in the Last Year: Not on file  . Ran Out of Food in the Last Year: Not on file  Transportation Needs:   . Lack of Transportation (Medical): Not on file  . Lack of Transportation (Non-Medical): Not on file  Physical Activity:   . Days of Exercise per Week: Not on file  . Minutes of Exercise per Session: Not on file  Stress:   .  Feeling of Stress : Not on file  Social Connections:   . Frequency of Communication with Friends and Family: Not on file  . Frequency of Social Gatherings with Friends and Family: Not on file  . Attends Religious Services: Not on file  . Active Member of Clubs or Organizations: Not on file  . Attends Archivist Meetings: Not on file  . Marital Status: Not on file  Intimate Partner Violence:   . Fear of Current or Ex-Partner: Not on file  . Emotionally Abused: Not on file  . Physically Abused: Not on file  . Sexually Abused: Not on file  :  Pertinent items are noted in HPI.  Exam: ECOG 0 General appearance: alert and cooperative appeared without distress. Head: atraumatic without any abnormalities. Eyes: conjunctivae/corneas clear. PERRL.  Sclera anicteric. Throat: lips, mucosa, and tongue normal; without oral thrush or ulcers. Resp: clear to auscultation bilaterally without rhonchi, wheezes or dullness to percussion. Cardio: regular rate and rhythm, S1, S2 normal, no murmur, click, rub or gallop GI: soft, non-tender; bowel sounds normal; no masses,  no organomegaly Skin: Skin color, texture, turgor normal. No rashes or lesions Lymph nodes: Cervical, supraclavicular, and axillary nodes normal. Neurologic: Grossly normal without any motor, sensory or deep tendon reflexes. Musculoskeletal: No joint deformity or effusion.    Assessment and Plan:    74 year old man with:  1.  Prostate cancer diagnosed and November 2020.  He presented with a PSA of 11.3 and a Gleason score of 4+3 = 7 with involvement in 3 cores of 4+3 = 7.  Staging work-up did not reveal any clear-cut evidence of metastatic disease from his prostate.   The natural course of this disease was reviewed and treatment options were reiterated.  His case was discussed in the prostate cancer multidisciplinary clinic including discussion with radiology regarding his imaging studies as well as his  prostate and  bladder pathology review with the reviewing pathologist.  Treatment options at this time were discussed and given his age and potential complications associated with radical surgery, definitive therapy with radiation and short course of androgen deprivation therapy is recommended.  The duration at this time of androgen deprivation will be close to 6 months and could be administered initially and radiation can be done at a later date upon completing androgen deprivation.  Complication associated with androgen deprivation including hot flashes, weight gain, sexual dysfunction among others.  Alternative option would be radical surgery that include cystoprostatectomy given his bladder cancer.  This will be a more radical option especially if he has higher risk disease or he develops relapse from his bladder tumor.  After discussion today, he is agreeable to proceed with androgen deprivation and radiation therapy.  2.  Bladder cancer diagnosed in February 2021.  He was found to have nonmuscle invasive high-grade urothelial carcinoma.  He is scheduled to have BCG treatments which is reasonable therapy at this time.  If you develop a BCG refractory disease radical cystectomy would be an option versus additional systemic therapy using Pembrolizumab as immunotherapy salvage agent.  All his questions are answered to his satisfaction.  45  minutes was dedicated to this visit.  The time was spent on reviewing imaging studies, discussing pathology reports, reviewing treatment options and future plan of care.    A copy of this consult has been forwarded to the requesting physician.

## 2019-11-12 NOTE — Progress Notes (Signed)
Radiation Oncology         [phone] ________________________________  Multidisciplinary Prostate Cancer Clinic  Initial Radiation Oncology Consultation  Name: Evan Mitchell MRN: [id number]  Date: 11/12/2019  DOB: 1946-01-26  [id number], [name], MD  Prince Solian, MD   REFERRING PHYSICIAN: Prince Solian, MD  DIAGNOSIS: 74 y.o. gentleman with stage T2a adenocarcinoma of the prostate with a Gleason's score of 4+3 and a PSA of 11.3    ICD-10-CM   1. Malignant neoplasm of prostate ([place])  Evan Mitchell is a 74 y.o. gentleman.  He has a history of elevated fluctuating PSA, with it being 8.8 in 09/2015, which has been followed by his primary care physician, Dr. Dagmar Hait. It continued to slowly rise, and in 06/2019, it was up to 11.3.  Accordingly, he was referred for evaluation in urology by Dr. Jeffie Pollock on 08/07/2019,  digital rectal examination was performed at that time revealing a 5 mm right mid lateral nodule.  The patient proceeded to transrectal ultrasound with 12 biopsies of the prostate on 09/18/2019.  The prostate volume measured 39 cc.  Out of 12 core biopsies,7 were positive, including all apex cores.  The maximum Gleason score was 4+3, and this was seen in right apex lateral, left mid, and right mid lateral. Gleason 3+4 was seen in right apex, left mid lateral, and left apex lateral. Gleason 3+3 was seen in left apex.  CT abdomen/pelvis performed on 10/10/2019 for disease staging showed an indeterminate lesion in posterior right bladder wall but no pelvic or abdominal lymphadenopathy and no skeletal or soft tissue metastasis. A Bone scan performed the same day showed an indeterminate area of increased activity in the midthoracic spine, likely degenerative. A chest CT was performed on 10/23/2019 for further evaluation of the spinal lesion and this showed no evidence of metastatic disease in the chest and only degenerative changes in spine at level  of abnormality on bone scan as well as a tiny sclerotic focus in T11, felt to be benign, possibly representing a bone island.  He underwent cystoscopy in the office on 10/23/19 for further evaluation of the abnormality seen on CT A/P and this confirmed a 2cm papillary bladder tumor.  He therefore underwent TURBT on 10/29/2019 with biopsy of the right trigone bladder tumor which revealed a noninvasive high grade papillary urothelial carcinoma without evidence of muscle invasion.  The patient reviewed the biopsy results with his urologist and he has kindly been referred today to the multidisciplinary prostate cancer clinic for presentation of pathology and radiology studies in our conference for discussion of potential radiation treatment options and clinical evaluation.  PREVIOUS RADIATION THERAPY: No  PAST MEDICAL HISTORY:  has a past medical history of Allergic rhinitis, Anxiety, Arthritis, History of syncope (10/2017), HTN (hypertension), Hypertriglyceridemia, Impaired fasting glucose, Inguinal hernia, LBBB (left bundle branch block) (12/12/2012), PONV (postoperative nausea and vomiting), Pre-diabetes, Prostate cancer ([place]), and Skin cancer (2018).    PAST SURGICAL HISTORY: Past Surgical History:  Procedure Laterality Date  . COLONOSCOPY    . HERNIA REPAIR  1995  . INGUINAL HERNIA REPAIR Left 02/28/2019   Procedure: LEFT INGUINAL HERNIA REPAIR WITH MESH;  Surgeon: Coralie Keens, MD;  Location: [facility];  Service: General;  Laterality: Left;  . TONSILLECTOMY  1947  . TRANSURETHRAL RESECTION OF BLADDER TUMOR N/A 10/29/2019   Procedure: TRANSURETHRAL RESECTION OF BLADDER TUMOR (TURBT)  INSTILL GEMCITABINE;  Surgeon: Irine Seal, MD;  Location: [facility];  Service: Urology;  Laterality: N/A;    FAMILY HISTORY: family history includes Cancer in his maternal grandfather; Diabetes in his mother; Heart attack in his mother; Hypertension in his mother; Melanoma in his mother, sister, and  sister; Skin cancer in his mother.  SOCIAL HISTORY:  reports that he quit smoking about 43 years ago. His smoking use included cigarettes. He has a 40.00 pack-year smoking history. He has never used smokeless tobacco. He reports that he does not drink alcohol or use drugs.  ALLERGIES: Aleve [naproxen sodium], Codeine, and Tramadol  MEDICATIONS:  Current Outpatient Medications  Medication Sig Dispense Refill  . acetaminophen (TYLENOL) 650 MG CR tablet Take 1,300 mg by mouth at bedtime as needed for pain.    Marland Kitchen allopurinol (ZYLOPRIM) 300 MG tablet Take 300 mg by mouth daily.     Marland Kitchen aspirin EC 81 MG tablet Take 81 mg by mouth daily.    . Calcium Carbonate-Vitamin D (CALCIUM + D PO) Take 2 tablets by mouth daily with breakfast.     . carvedilol (COREG) 25 MG tablet Take 1 tablet (25 mg total) by mouth 2 (two) times daily with a meal. 180 tablet 0  . GLUCOSAMINE-CHONDROITIN PO Take 1 tablet by mouth 3 (three) times daily with meals.     . hydrochlorothiazide (HYDRODIURIL) 25 MG tablet TAKE 1 TABLET (25 MG TOTAL) BY MOUTH DAILY. (Patient taking differently: Take 25 mg by mouth daily. ) 90 tablet 2  . lisinopril (PRINIVIL,ZESTRIL) 40 MG tablet Take 40 mg by mouth daily.    . Melatonin 3 MG TABS Take 6 mg by mouth at bedtime.    . Multiple Vitamins-Minerals (PRESERVISION AREDS PO) Take 1 tablet by mouth 2 (two) times daily. Breakfast & supper    . NON FORMULARY Elderberry gummies    . Omega-3 Fatty Acids (FISH OIL PO) Take 1 tablet by mouth 3 (three) times daily with meals.     . simvastatin (ZOCOR) 40 MG tablet Take 40 mg by mouth daily with supper.     . vitamin C (ASCORBIC ACID) 500 MG tablet Take 500 mg by mouth daily with breakfast.      No current facility-administered medications for this encounter.   Facility-Administered Medications Ordered in Other Encounters  Medication Dose Route Frequency Provider Last Rate Last Admin  . gemcitabine (GEMZAR) chemo syringe for bladder instillation 2,000  mg  2,000 mg Bladder Instillation Once Irine Seal, MD        REVIEW OF SYSTEMS:  On review of systems, the patient reports that he is doing well overall. He denies any chest pain, shortness of breath, cough, fevers, chills, night sweats, unintended weight changes. He denies any bowel disturbances, and denies abdominal pain, nausea or vomiting. He denies any new musculoskeletal or joint aches or pains. His IPSS was 3, indicating mild urinary symptoms with nocturia x 1 and only occasional frequency/urgency. His SHIM was 21, indicating he does have mild erectile dysfunction. A complete review of systems is obtained and is otherwise negative.   PHYSICAL EXAM:  Wt Readings from Last 3 Encounters:  11/12/19 214 lb 12.8 oz (97.4 kg)  10/29/19 213 lb 4 oz (96.7 kg)  10/28/19 213 lb 4 oz (96.7 kg)   Temp Readings from Last 3 Encounters:  11/12/19 98.3 F (36.8 C)  10/29/19 98.1 F (36.7 C) (Oral)  10/28/19 97.7 F (36.5 C) (Oral)   BP Readings from Last 3 Encounters:  11/12/19 (!) 167/80  10/29/19 (!) 195/98  10/28/19 (!) 177/91  Pulse Readings from Last 3 Encounters:  11/12/19 81  10/29/19 96  10/28/19 81   Pain Assessment Pain Score: 0-No pain/10 In general this is a well appearing Caucasian male in no acute distress. He's alert and oriented x4 and appropriate throughout the examination. Cardiopulmonary assessment is negative for acute distress and he exhibits normal effort.    KPS = 90  100 - Normal; no complaints; no evidence of disease. 90   - Able to carry on normal activity; minor signs or symptoms of disease. 80   - Normal activity with effort; some signs or symptoms of disease. 38   - Cares for self; unable to carry on normal activity or to do active work. 60   - Requires occasional assistance, but is able to care for most of his personal needs. 50   - Requires considerable assistance and frequent medical care. 7   - Disabled; requires special care and assistance. 12   -  Severely disabled; hospital admission is indicated although death not imminent. 51   - Very sick; hospital admission necessary; active supportive treatment necessary. 10   - Moribund; fatal processes progressing rapidly. 0     - Dead  Karnofsky DA, Abelmann [place], Craver LS and [facility] [phone]) The use of the nitrogen mustards in the palliative treatment of carcinoma: with particular reference to bronchogenic carcinoma Cancer 1 634-56   LABORATORY DATA:  Lab Results  Component Value Date   WBC 5.6 10/28/2019   HGB 16.4 10/28/2019   HCT 51.1 10/28/2019   MCV 91.1 10/28/2019   PLT 130 (L) 10/28/2019   Lab Results  Component Value Date   NA 141 10/28/2019   K 3.8 10/28/2019   CL 105 10/28/2019   CO2 28 10/28/2019   Lab Results  Component Value Date   ALT 46 (H) 11/30/2018   AST 38 11/30/2018   ALKPHOS 42 11/30/2018   BILITOT 0.6 11/30/2018     RADIOGRAPHY: No results found.    IMPRESSION/PLAN: 74 y.o. gentleman with Stage T2a adenocarcinoma of the prostate with a PSA of 11.3 and a Gleason score of 4+3.    We discussed the patient's workup and outlined the nature of prostate cancer in this setting. The patient's T stage, Gleason's score, and PSA put him into the unfavorable intermediate risk group. Accordingly, he is eligible for a variety of potential treatment options including prostatectomy, brachytherapy or 5.5 weeks of external radiation. We discussed the available radiation techniques, and focused on the details and logistics of delivery. We discussed and outlined the risks, benefits, short and long-term effects associated with radiotherapy and compared and contrasted these with prostatectomy. We discussed the role of SpaceOAR in reducing the rectal toxicity associated with radiotherapy. We also detailed the role of ADT in the treatment of unfavorable intermediate risk prostate cancer and outlined the associated side effects that could be expected with this therapy. We explained  the rationale for intentional delay in starting radiation for at least 2 months after starting ADT to allow for the radiosensitizing effects of this treatment and how this would allow for treatment of the bladder cancer in the interim and assessment of his response to intravesical BCG prior to starting his prostate radiation. This was the consensus recommendation at multidisciplinary clinic.  He was encouraged to ask questions that were answered to his stated satisfaction.  At the end of the conversation the patient is interested in moving forward with ST-ADT now in anticipation of proceeding with either a 5.5  week course of external beam radiation or brachytherapy in the next 2-4 months, pending his response to intravesical BCG for treatment of his bladder cancer.  We will coordinate for a follow-up visit to initiate ADT, first available and then will continue to follow in his care and await the direction from Dr. Jeffie Pollock regarding timing for starting his radiotherapy.  He is currently undecided between the 5-1/2-week course of daily radiotherapy versus brachytherapy but plans to make a decision after further discussion with Dr. Jeffie Pollock at the time of his follow-up visit.  If he elects to proceed with daily radiotherapy, we will need to coordinate for fiducial markers and spaceOAR gel placement prior to CT simulation in preparation for his daily treatments.  He appears to have a good understanding of his disease and our treatment recommendations which are of curative intent.  He is comfortable and in agreement with the stated plan.  Therefore, we will share our discussion with Dr. Jeffie Pollock and move forward with coordinating his ADT.  He knows that he is welcome to call with any questions or concerns in the interim.     Nicholos Johns, PA-C    Tyler Pita, MD  [facility] Direct Dial: [phone]  Fax: [phone] [email or web address]  Skype  [email or web address]   This document serves as a  record of services personally performed by Tyler Pita, MD and Freeman Caldron, PA-C. It was created on their behalf by Wilburn Mylar, a trained medical scribe. The creation of this record is based on the scribe's personal observations and the provider's statements to them. This document has been checked and approved by the attending provider.

## 2019-11-13 ENCOUNTER — Telehealth: Payer: Self-pay | Admitting: Oncology

## 2019-11-13 NOTE — Consult Note (Signed)
Office Visit Report     11/12/2019   --------------------------------------------------------------------------------   Evan Mitchell  MRN: [id number]  DOB: 08-09-1946, 74 year old Male  SSN:    PRIMARY CARE:  Evan Morita, MD  REFERRING:  Evan Morita, MD  PROVIDER:  Irine Mitchell, M.D.  TREATING:  Evan Mitchell, M.D.  LOCATION:  [facility] [phone]     --------------------------------------------------------------------------------   CC/HPI: CC: Prostate Cancer and Bladder Cancer   Physician requesting consult: Dr. Irine Mitchell  PCP: Dr. Berneta Mitchell  Location of consult: [facility] - Prostate Cancer Multidisciplinary [facility]   Evan Mitchell is a 75 year old gentleman with a history of hypertension, hyperlipidemia, gout, arthritis, anxiety, and incidentally detected, asymptomatic CAD who has a history of an elevated PSA dating back to 2017 when it was 8.3. This was followed by his PCP and remained elevated increasing to over 11 in 2020. He underwent an initial urologic consultation in November 2020 and his PSA was persistently elevated at 11.3 and he was noted to have a 5 mm right mid prostate nodule. He proceeded with a TRUS biopsy of the prostate on 09/18/19 that confirmed Gleason 4+3=7 adenocarcinoma of the prostate with 7 out of 12 biopsy cores positive for malignancy. He underwent staging imaging on 10/10/19 with a CT scan of the abdomen and pelvis and bone scan. His CT indicated no evidence of malignancy. His bone scan did demonstrate non-specific uptake of the mid thoracic spine that was probably degenerative but metastatic disease could not be ruled out. CT imaging of the chest on 10/23/19 indicated degenerative changes in the area of concern on bone scan and a separate sclerotic focus on T11 that was likely a bone island. He was also noted to incidentally have 3-vessel CAD.   During his staging evaluation with a CT of the abdomen and pelvis on  10/10/19, he was incidentally noted to have a bladder tumor of about 3 cm. TURBT of his bladder tumor was performed on 10/29/19 and demonstrated high grade, Ta urothelial carcinoma of the bladder with muscularis propria present in the specimen but negative for tumor. He is scheduled to begin intravesical BCG therapy in the next few weeks.   Family history: None.   Imaging studies: See above.   PMH: He has a history of hypertension, hyperlipidemia, gout, arthritis, anxiety, and CAD.  PSH: Left inguinal hernia repair (x 2)   TNM stage: cT2 N0 M0 (right mid nodule)  PSA: 11.3  Gleason score: 4+3=7 (Grade group 3)  Biopsy (09/18/19): 7/12 cores positive  Left: L lateral apex (36%, 3+4=7), L apex (14%, 3+3=6), L lateral mid (7%, 3+4=7), L mid (3%, 4+3=7)  Right: R apex (6%, 3+4=7), R lateral apex (33%, 4+3=7), R lateral mid (5%, 4+3=7)  Prostate volume: 39.0 cc   Nomogram  OC disease: 17%  EPE: 80%  SVI: 27%  LNI: 27%  PFS (5 year, 10 year): 42%, 27%   Urinary function: He had minimal symptoms at baseline. He has had some increased urinary urgency and frequency symptoms since his TURBT understandably.  Erectile function: He does have preserved erectile function.     ALLERGIES: codeine    MEDICATIONS: Allopurinol 300 mg tablet  Hydrochlorothiazide 25 mg tablet  Lisinopril 40 mg tablet  Simvastatin 40 mg tablet  Airborne Elderberry Gummy  Aspirin Ec 81 mg tablet, delayed release  Calcium Ascorbate  Carvedilol 25 mg tablet  Glucosamine & Chondroitin  Levofloxacin 750 mg tablet 1 po  1 hour prior to biopsy  Loratadine 10 mg tablet  Melatonin 3 mg tablet  Omega-3 Fish Oil  Preservision Areds  Tylenol  Vitamin C     GU PSH: Cystoscopy - 10/23/2019 Cystoscopy TURBT 2-5 cm - 10/29/2019 Cystoscopy Ureteroscopy, Right - 10/29/2019 Locm 300-399Mg /Ml Iodine,1Ml - 10/10/2019 Prostate Needle Biopsy - 09/18/2019       PSH Notes: LIH x 2.    NON-GU PSH: Hernia Repair Surgical Pathology,  Gross And Microscopic Examination For Prostate Needle - 09/18/2019     GU PMH: Bladder Cancer Trigone , He has HG NMIBC and will need BCG which I hope to get done prior to initiation of radiation therapy for his prostate cancer. he is getting his Covid vaccines so I will delay the BCG start until he has completed that series in mid March. - 11/11/2019, He has a 2cm papillary lesion at the mid trigone between the UO's. he is going to need a TURBT with Gemcitabine before we initiate definitive therapy for the prostate cancer. I reviewed the risks of bleeding, infection, bladder and ureteral injury, chemical cystitis, thrombotic events and anesthetic complications. , - 10/23/2019 Prostate Cancer, T2a N0 M0 GG3 unfavorable intermediate risk prostate cancer. At 3 I lean toward Adjuvant ADT with EXRT but with the bladder tumor, unless it proves to be prostatic in origin, I will have him seen in the [place] to further review the option. - 10/23/2019 Urinary Urgency - 10/23/2019 Elevated PSA - 09/18/2019, he has a slowly rising PSA with a right prostate nodule and will be set up for a prostate [place] and biopsy. I have reviewed the risks of bleeding, infection and voiding difficulty. Levaquin sent for the prep. , - 08/07/2019 Prostate nodule w/ LUTS - 08/07/2019    NON-GU PMH: Anxiety Arthritis Gout Hypercholesterolemia Hypertension    FAMILY HISTORY: Heart Attack - Mother   SOCIAL HISTORY: Marital Status: Married Preferred Language: English; Race: White Current Smoking Status: Patient does not smoke anymore. Has not smoked since 07/21/1979. Smoked for 20 years. Smoked 2 packs per day.   Tobacco Use Assessment Completed: Used Tobacco in last 30 days? Light Drinker.  Drinks 3 caffeinated drinks per day. 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     Notes: 2 daughters    REVIEW OF SYSTEMS:    GU Review Male:   Patient denies frequent urination, hard to postpone urination, burning/ pain with urination,  get up at night to urinate, leakage of urine, stream starts and stops, trouble starting your streams, and have to strain to urinate .  Gastrointestinal (Upper):   Patient denies nausea and vomiting.  Gastrointestinal (Lower):   Patient denies constipation and diarrhea.  Constitutional:   Patient denies fever, night sweats, weight loss, and fatigue.  Skin:   Patient denies skin rash/ lesion and itching.  Eyes:   Patient denies blurred vision and double vision.  Ears/ Nose/ Throat:   Patient denies sore throat and sinus problems.  Hematologic/Lymphatic:   Patient denies swollen glands and easy bruising.  Cardiovascular:   Patient denies leg swelling and chest pains.  Respiratory:   Patient denies cough and shortness of breath.  Endocrine:   Patient denies excessive thirst.  Musculoskeletal:   Patient denies back pain and joint pain.  Neurological:   Patient denies headaches and dizziness.  Psychologic:   Patient denies depression and anxiety.   VITAL SIGNS: None   MULTI-SYSTEM PHYSICAL EXAMINATION:    Constitutional: Well-nourished. No physical deformities. Normally developed. Good grooming.  PAST DATA REVIEWED:  Source Of History:  Patient  Lab Test Review:   PSA  Records Review:   Pathology Reports  Urine Test Review:   Urinalysis   07/11/19 03/29/19 09/21/18 07/06/18 03/14/18 08/17/17 02/09/17 07/13/16  PSA  Total PSA 11.3 ng/dl 11.867 ng/dl 9.2 ng/dl 10.1 ng/dl 10.305 ng/dl 9.626 ng/dl 7.44 ng/dl 8.3 ng/dl   Notes:                     CLINICAL DATA: Prostate cancer with rising PSA   EXAM:  NUCLEAR MEDICINE WHOLE BODY BONE SCAN   TECHNIQUE:  Whole body anterior and posterior images were obtained approximately  3 hours after intravenous injection of radiopharmaceutical.   RADIOPHARMACEUTICALS: 20.9 mCi Technetium-44m MDP IV   COMPARISON: CT scan of the abdomen and pelvis dated 10/10/2019   FINDINGS:  There is increased activity to the right at L4-5. This correlates   with an area of benign endplate sclerosis and prominent osteophytes  to the right of midline at L4-5.   There is a subtle area of increased activity in the midthoracic  spine, probably T7 or T8 there are no helpful prior chest images to  assess that area.   Increased activity at the first MTP joints, both knees, and both  wrists are all consistent with osteoarthritis. Increased activity at  the sternoclavicular joints is felt to be degenerative in origin.   Subtle increased activity in the right side of the mid cervical  spine is also likely degenerative in origin.   IMPRESSION:  1. Indeterminate area of increased activity in the midthoracic  spine. CT scan of the thoracic spine without contrast is recommended  for further evaluation. I think this is likely to be degenerative in  origin.  2. Multiple other foci of increased activity are consistent with  arthritic changes.    Electronically Signed  By: Lorriane Shire M.D.  On: 10/10/2019 16:49   CLINICAL DATA: History of prostate cancer, abnormal finding on bone  scan in the thoracic spine   EXAM:  CT CHEST WITHOUT CONTRAST   TECHNIQUE:  Multidetector CT imaging of the chest was performed following the  standard protocol without IV contrast.   COMPARISON: CT abdomen and pelvis from 10/10/2019   FINDINGS:  Cardiovascular: Calcific atherosclerotic changes throughout the  thoracic aorta. No signs of aneurysm. Three-vessel coronary artery  disease. Heart size normal without pericardial effusion. Limited  assessment of vascular structures in general and chest without  contrast. Normal caliber central pulmonary arteries.   Mediastinum/Nodes: No signs of axillary, mediastinal or hilar  lymphadenopathy. Esophagus is normal.   Lungs/Pleura: No signs of consolidation or pleural effusion. No  signs of suspicious mass or nodule. Airways are patent. Signs of  basilar atelectasis.   Upper Abdomen: No signs of acute upper  abdominal process. Chronic  appearing perinephric stranding, kidneys partially imaged. Finding  similar to the prior exam.   Musculoskeletal: Spinal degenerative changes no sign of destructive  bone process. Area of sclerosis in T11 vertebral body, not  corresponding to the area of concern on the bone scan and measuring  3 mm. Also with sclerotic lesion in the pedicle of T11 on the left   IMPRESSION:  1. No CT evidence of metastatic disease in the chest.  2. Tiny sclerotic focus in the T11 vertebral body and in posterior  elements of T11 within the pedicle. Finding could represent bone  islands at this location. These areas do not correspond with the  abnormality seen on the previous bone scan.  3. Degenerative changes in the spine at the level of the abnormality  seen on the bone scan in the midthoracic spine with disc osteophyte  complexes and loss of disc height.  4. Three-vessel coronary artery disease.   Aortic Atherosclerosis (ICD10-I70.0).    Electronically Signed  By: Zetta Bills M.D.  On: 10/23/2019 15:25   CLINICAL DATA: Prostate cancer   EXAM:  CT ABDOMEN AND PELVIS WITHOUT AND WITH CONTRAST   TECHNIQUE:  Multidetector CT imaging of the abdomen and pelvis was performed  following the standard protocol before and following the bolus  administration of intravenous contrast.   CONTRAST: 100 cc Omnipaque   COMPARISON: None.   FINDINGS:  Lower chest: Lung bases are clear.   Hepatobiliary: Hypervascular subcapsular focus in the posterior  RIGHT hepatic lobe consistent with vascular phenomena on potential  portal venous shunting (image 25/5. Gallbladder normal   Pancreas: Pancreas is normal. No ductal dilatation. No pancreatic  inflammation.   Spleen: Normal spleen   Adrenals/urinary tract: Nonenhancing cyst in the LEFT kidney. RIGHT  kidney is normal. No filling defects in the collecting systems.  Adrenal glands normal.   Along the posterior wall of the  bladder, adjacent to the RIGHT  vesicoureteral junction, there is thickened tissue measuring 10 mm  by 5 mm (image 87/5). The distal RIGHT ureter is mildly dilated. The  proximal RIGHT ureter is normal caliber.   Stomach/Bowel: Stomach, small bowel, appendix, and cecum are normal.  Diverticular the descending colon. No acute inflammation.   Vascular/Lymphatic: Abdominal aorta is heavily calcified.  Nonaneurysmal. No retroperitoneal lymphadenopathy.   No pelvic lymphadenopathy   Reproductive: Bilateral fat filled inguinal hernias.   Prostate gland is grossly normal by CT. Seminal vesicles grossly  normal   Other: No free fluid.   Musculoskeletal: No aggressive osseous lesion.   IMPRESSION:  1. Prostate gland is grossly normal by CT.  2. No pelvic or abdominal lymphadenopathy.  3. No skeletal metastasis or soft tissue metastasis.  4. Indeterminate lesion in the posterior RIGHT wall the bladder  adjacent to the RIGHT vesicoureteral. Recommend cystoscopy to  evaluate for UROTHELIAL NEOPLASM within the bladder.   These results will be called to the ordering clinician or  representative by the Radiologist Assistant, and communication  documented in the PACS or zVision Dashboard.    Electronically Signed  By: Suzy Bouchard M.D.  On: 10/10/2019 10:11   PROCEDURES: None   ASSESSMENT:      ICD-10 Details  1 GU:   Prostate Cancer - C61   2   Bladder Cancer Trigone - C67.0    PLAN:           Document Letter(s):  Created for Patient: Clinical Summary         Notes:   1. Unfavorable intermediate risk prostate cancer: I had a detailed discussion with Mr. Vanorden and his wife today regarding his prostate cancer diagnosis. It was recommended that he proceed with therapy of curative intent considering his disease parameters and life expectancy. We discussed treatment options in the context of his bladder cancer as outlined below. The patient was counseled about the natural history  of prostate cancer and the standard treatment options that are available for prostate cancer. It was explained to him how his age and life expectancy, clinical stage, Gleason score, and PSA affect his prognosis, the decision to proceed with additional staging studies, as well as how that information influences recommended treatment strategies.  We discussed the roles for active surveillance, radiation therapy, surgical therapy, androgen deprivation, as well as ablative therapy options for the treatment of prostate cancer as appropriate to his individual cancer situation. We discussed the risks and benefits of these options with regard to their impact on cancer control and also in terms of potential adverse events, complications, and impact on quality of life particularly related to urinary and sexual function. The patient was encouraged to ask questions throughout the discussion today and all questions were answered to his stated satisfaction. In addition, the patient was provided with and/or directed to appropriate resources and literature for further education about prostate cancer and treatment options.   Ultimately, he is most interested in radiation therapy for treatment. He understands the increased risk of urinary incontinence with surgical therapy at his age. Considering his bladder cancer which will require intravesical therapy, we agreed to begin androgen deprivation therapy followed by delayed radiation treatment for definitive, curative treatment. He is scheduled to see Dr. Tammi Klippel later this afternoon. He will discuss this further. He is also scheduled to see Dr. Alen Blew. I will relay this information to Dr. Jeffie Pollock.   2. High risk, non muscle invasive bladder cancer: He will proceed with intravesical BCG therapy as indicated. Assuming he has an appropriate response to treatment, he likely can proceed with radiation therapy shortly thereafter and continue with maintenance BCG.   Cc: Dr. Berneta Mitchell   Dr. Irine Mitchell  Dr. Tyler Pita  Dr. Zola Button        Next Appointment:      Next Appointment: 12/27/2019 02:00 PM    Appointment Type: BCG Treatment    Location: [facility] [phone]    Provider: NVPodA NVPodA    Reason for Visit: 1 of [place]: I spent 42 minutes total time with the patient.

## 2019-11-13 NOTE — Telephone Encounter (Signed)
No los per 2/23.

## 2019-11-15 DIAGNOSIS — L578 Other skin changes due to chronic exposure to nonionizing radiation: Secondary | ICD-10-CM | POA: Diagnosis not present

## 2019-11-15 DIAGNOSIS — L820 Inflamed seborrheic keratosis: Secondary | ICD-10-CM | POA: Diagnosis not present

## 2019-11-15 DIAGNOSIS — Z85828 Personal history of other malignant neoplasm of skin: Secondary | ICD-10-CM | POA: Diagnosis not present

## 2019-11-15 DIAGNOSIS — L821 Other seborrheic keratosis: Secondary | ICD-10-CM | POA: Diagnosis not present

## 2019-11-19 ENCOUNTER — Telehealth: Payer: Self-pay | Admitting: Medical Oncology

## 2019-11-19 NOTE — Telephone Encounter (Signed)
Called patient to follow up post [facility].Spoke with Theresa-wife and she states he has not heard from Dr. Ralene Muskrat office about hormone injection. She called Dr. Ralene Muskrat nurse this morning and she has to speak with Dr. Jeffie Pollock. I will follow up Thursday morning if she has not heard back from Dr. Ralene Muskrat office.  She voiced appreciation for the clinic and all the information they received. They are happy to have a plan in place and ready to move forward. I asked her to call me with questions or concerns. She voiced understanding.

## 2019-11-20 DIAGNOSIS — C61 Malignant neoplasm of prostate: Secondary | ICD-10-CM | POA: Diagnosis not present

## 2019-11-20 DIAGNOSIS — Z5111 Encounter for antineoplastic chemotherapy: Secondary | ICD-10-CM | POA: Diagnosis not present

## 2019-11-21 ENCOUNTER — Encounter: Payer: Self-pay | Admitting: Medical Oncology

## 2019-11-23 DIAGNOSIS — H2511 Age-related nuclear cataract, right eye: Secondary | ICD-10-CM | POA: Diagnosis not present

## 2019-11-28 ENCOUNTER — Encounter: Payer: Self-pay | Admitting: Medical Oncology

## 2019-11-28 DIAGNOSIS — H2511 Age-related nuclear cataract, right eye: Secondary | ICD-10-CM | POA: Diagnosis not present

## 2019-11-30 DIAGNOSIS — H2511 Age-related nuclear cataract, right eye: Secondary | ICD-10-CM | POA: Diagnosis not present

## 2019-12-03 ENCOUNTER — Ambulatory Visit: Payer: Medicare Other | Attending: Internal Medicine

## 2019-12-03 ENCOUNTER — Encounter: Payer: Self-pay | Admitting: Urology

## 2019-12-03 DIAGNOSIS — Z23 Encounter for immunization: Secondary | ICD-10-CM

## 2019-12-03 NOTE — Progress Notes (Signed)
this patient is interested in ST-ADT concurrent with 5.5 weeks of prostate IMRT for treatment of his prostate cancer. Firmagon started 11/20/19 to allow time to see how his newly diagnosed bladder cancer responds to intravesical BCG treatments prior to starting prostate radiation since we would not start XRT any sooner than 2 months from start of ADT anyway. He is comfortable and in agreement with this plan so we will continue to follow his progress and wait for Wrenn's cue as to when he is ready for prostate radiation.     Nicholos Johns, MMS, PA-C [place] [facility] [place]: [phone]  Fax: [phone]

## 2019-12-03 NOTE — Progress Notes (Signed)
   Covid-19 Vaccination Clinic  Name:  Evan Mitchell    MRN: [id number] DOB: May 13, 1946  12/03/2019  [name] was observed post Covid-19 immunization for 15 minutes without incident. He was provided with Vaccine Information Sheet and instruction to access the V-Safe system.   [name] was instructed to call 911 with any severe reactions post vaccine: Marland Kitchen Difficulty breathing  . Swelling of face and throat  . A fast heartbeat  . A bad rash all over body  . Dizziness and weakness   Immunizations Administered    Name Date Dose VIS Date Route   Pfizer COVID-19 Vaccine 12/03/2019  8:24 AM 0.3 mL 08/30/2019 Intramuscular   Manufacturer: [place]   Lot: [id number]   [place]: [id number]

## 2019-12-17 DIAGNOSIS — Z0120 Encounter for dental examination and cleaning without abnormal findings: Secondary | ICD-10-CM | POA: Diagnosis not present

## 2019-12-24 ENCOUNTER — Telehealth: Payer: Self-pay | Admitting: Cardiology

## 2019-12-24 MED ORDER — CARVEDILOL 25 MG PO TABS: 25.0000 mg | ORAL_TABLET | Freq: Two times a day (BID) | ORAL | 3 refills | Status: DC

## 2019-12-24 NOTE — Telephone Encounter (Signed)
Patient's wife, Clarene Critchley, states she is requesting to speak with Dr. Doug Sou nurse, Malachy Mood, regarding a personal matter. Please return call to discuss.

## 2019-12-24 NOTE — Telephone Encounter (Signed)
Spoke with patient's spouse per DPR. Spouse needs a refill for carvedilol for patient. Carvedilol refilled with [facility]. Patient unable to request refill online, refill request processed on the phone. Patient to call back and schedule October appointment when it is closer to that time.

## 2019-12-27 DIAGNOSIS — C61 Malignant neoplasm of prostate: Secondary | ICD-10-CM | POA: Diagnosis not present

## 2019-12-27 DIAGNOSIS — C670 Malignant neoplasm of trigone of bladder: Secondary | ICD-10-CM | POA: Diagnosis not present

## 2019-12-27 DIAGNOSIS — Z5111 Encounter for antineoplastic chemotherapy: Secondary | ICD-10-CM | POA: Diagnosis not present

## 2020-01-03 DIAGNOSIS — Z5111 Encounter for antineoplastic chemotherapy: Secondary | ICD-10-CM | POA: Diagnosis not present

## 2020-01-03 DIAGNOSIS — C670 Malignant neoplasm of trigone of bladder: Secondary | ICD-10-CM | POA: Diagnosis not present

## 2020-01-10 DIAGNOSIS — C670 Malignant neoplasm of trigone of bladder: Secondary | ICD-10-CM | POA: Diagnosis not present

## 2020-01-10 DIAGNOSIS — Z5111 Encounter for antineoplastic chemotherapy: Secondary | ICD-10-CM | POA: Diagnosis not present

## 2020-01-17 DIAGNOSIS — C670 Malignant neoplasm of trigone of bladder: Secondary | ICD-10-CM | POA: Diagnosis not present

## 2020-01-17 DIAGNOSIS — Z5111 Encounter for antineoplastic chemotherapy: Secondary | ICD-10-CM | POA: Diagnosis not present

## 2020-01-24 DIAGNOSIS — Z5111 Encounter for antineoplastic chemotherapy: Secondary | ICD-10-CM | POA: Diagnosis not present

## 2020-01-24 DIAGNOSIS — C670 Malignant neoplasm of trigone of bladder: Secondary | ICD-10-CM | POA: Diagnosis not present

## 2020-01-31 DIAGNOSIS — Z5111 Encounter for antineoplastic chemotherapy: Secondary | ICD-10-CM | POA: Diagnosis not present

## 2020-01-31 DIAGNOSIS — C670 Malignant neoplasm of trigone of bladder: Secondary | ICD-10-CM | POA: Diagnosis not present

## 2020-01-31 DIAGNOSIS — C61 Malignant neoplasm of prostate: Secondary | ICD-10-CM | POA: Diagnosis not present

## 2020-02-20 ENCOUNTER — Encounter: Payer: Self-pay | Admitting: Medical Oncology

## 2020-02-20 NOTE — Progress Notes (Signed)
Called patient to follow up on bladder cancer treatment and PMDC follow up.Per his wife, he completed 6 cycles of BCG on 5/14 and received Firmagon 80 mg. He will be scheduled for a cysto 6 weeks post BCG. If clear, he will proceed with radiation, if not he will need further treatment. I discussed the next steps with placement of gold markers/SpaceOar and CT simulations. She had questions about the procedures and the start of radiation. All questions were answered and I confirmed she still has my contact information. I encouraged her to call me with questions or concerns.

## 2020-03-03 DIAGNOSIS — Z5111 Encounter for antineoplastic chemotherapy: Secondary | ICD-10-CM | POA: Diagnosis not present

## 2020-03-03 DIAGNOSIS — C61 Malignant neoplasm of prostate: Secondary | ICD-10-CM | POA: Diagnosis not present

## 2020-03-16 DIAGNOSIS — R3915 Urgency of urination: Secondary | ICD-10-CM | POA: Diagnosis not present

## 2020-03-16 DIAGNOSIS — N403 Nodular prostate with lower urinary tract symptoms: Secondary | ICD-10-CM | POA: Diagnosis not present

## 2020-03-16 DIAGNOSIS — R6882 Decreased libido: Secondary | ICD-10-CM | POA: Diagnosis not present

## 2020-03-16 DIAGNOSIS — C61 Malignant neoplasm of prostate: Secondary | ICD-10-CM | POA: Diagnosis not present

## 2020-03-26 ENCOUNTER — Other Ambulatory Visit: Payer: Self-pay | Admitting: Urology

## 2020-03-26 DIAGNOSIS — C61 Malignant neoplasm of prostate: Secondary | ICD-10-CM

## 2020-04-02 ENCOUNTER — Encounter: Payer: Self-pay | Admitting: Medical Oncology

## 2020-04-02 NOTE — Progress Notes (Signed)
Wife called stating patient is experiencing some depression since being diagnosis with prostate and bladder cancer. He recently got good news regarding bladder cancer and will begin radiation in the near future. He justs wants for all of this to be over. We discussed how it has been a long drawn out process having to get BCG treatments, ADT and all of this during the  [place] pandemic. I asked her to call his primary care physician to discuss depression and medications. I offered our prostate support group, support services here at [facility]  or a Product manager. She states he is a very private person and is not up for that at this time. He is scheduled for CT simulation 7/27. I asked her to please call me if I can answer questions or support him in any way. She voiced understanding.

## 2020-04-03 DIAGNOSIS — C61 Malignant neoplasm of prostate: Secondary | ICD-10-CM | POA: Diagnosis not present

## 2020-04-03 DIAGNOSIS — Z5111 Encounter for antineoplastic chemotherapy: Secondary | ICD-10-CM | POA: Diagnosis not present

## 2020-04-09 DIAGNOSIS — C61 Malignant neoplasm of prostate: Secondary | ICD-10-CM | POA: Diagnosis not present

## 2020-04-13 ENCOUNTER — Telehealth: Payer: Self-pay | Admitting: *Deleted

## 2020-04-13 NOTE — Telephone Encounter (Signed)
Called patient to remind of sim appt. and MRI for 04-14-20, spoke with patient's wife- Evan Mitchell and she is aware of these appts.

## 2020-04-13 NOTE — Progress Notes (Signed)
  Radiation Oncology         [phone] ________________________________  Name: Evan Mitchell MRN: [id number]  Date: 04/14/2020  DOB: 03/04/1946  SIMULATION AND TREATMENT PLANNING NOTE    ICD-10-CM   1. Malignant neoplasm of prostate ([place])  C61     DIAGNOSIS:  74 y.o. gentleman with stage T2a adenocarcinoma of the prostate with a Gleason's score of 4+3 and a PSA of 11.3  NARRATIVE:  The patient was brought to the [place].  Identity was confirmed.  All relevant records and images related to the planned course of therapy were reviewed.  The patient freely provided informed written consent to proceed with treatment after reviewing the details related to the planned course of therapy. The consent form was witnessed and verified by the simulation staff.  Then, the patient was set-up in a stable reproducible supine position for radiation therapy.  A vacuum lock pillow device was custom fabricated to position his legs in a reproducible immobilized position.  Then, I performed a urethrogram under sterile conditions to identify the prostatic apex.  CT images were obtained.  Surface markings were placed.  The CT images were loaded into the planning software.  Then the prostate target and avoidance structures including the rectum, bladder, bowel and hips were contoured.  Treatment planning then occurred.  The radiation prescription was entered and confirmed.  A total of one complex treatment devices was fabricated. I have requested : Intensity Modulated Radiotherapy (IMRT) is medically necessary for this case for the following reason:  Rectal sparing.Marland Kitchen  PLAN:  The patient will receive 70 Gy in 28 fractions.  ________________________________  Sheral Apley Tammi Klippel, M.D.

## 2020-04-14 ENCOUNTER — Ambulatory Visit
Admission: RE | Admit: 2020-04-14 | Discharge: 2020-04-14 | Disposition: A | Discharge status: Home / Self Care | Payer: Medicare Other | Source: Ambulatory Visit | Attending: Radiation Oncology | Admitting: Radiation Oncology

## 2020-04-14 ENCOUNTER — Ambulatory Visit (HOSPITAL_COMMUNITY)
Admission: RE | Admit: 2020-04-14 | Discharge: 2020-04-14 | Disposition: A | Discharge status: Home / Self Care | Payer: Medicare Other | Source: Ambulatory Visit | Attending: Urology | Admitting: Urology

## 2020-04-14 ENCOUNTER — Encounter: Payer: Self-pay | Admitting: Medical Oncology

## 2020-04-14 ENCOUNTER — Other Ambulatory Visit: Payer: Self-pay

## 2020-04-14 DIAGNOSIS — C61 Malignant neoplasm of prostate: Secondary | ICD-10-CM

## 2020-04-15 DIAGNOSIS — C61 Malignant neoplasm of prostate: Secondary | ICD-10-CM | POA: Diagnosis not present

## 2020-04-23 ENCOUNTER — Other Ambulatory Visit: Payer: Self-pay

## 2020-04-23 ENCOUNTER — Ambulatory Visit
Admission: RE | Admit: 2020-04-23 | Discharge: 2020-04-23 | Disposition: A | Discharge status: Home / Self Care | Payer: Medicare Other | Source: Ambulatory Visit | Attending: Radiation Oncology | Admitting: Radiation Oncology

## 2020-04-23 DIAGNOSIS — C61 Malignant neoplasm of prostate: Secondary | ICD-10-CM | POA: Insufficient documentation

## 2020-04-24 ENCOUNTER — Ambulatory Visit
Admission: RE | Admit: 2020-04-24 | Discharge: 2020-04-24 | Disposition: A | Discharge status: Home / Self Care | Payer: Medicare Other | Source: Ambulatory Visit | Attending: Radiation Oncology | Admitting: Radiation Oncology

## 2020-04-24 ENCOUNTER — Other Ambulatory Visit: Payer: Self-pay

## 2020-04-24 DIAGNOSIS — C61 Malignant neoplasm of prostate: Secondary | ICD-10-CM | POA: Diagnosis not present

## 2020-04-24 NOTE — Progress Notes (Signed)
Complete post sim education with patient today. Oriented patient to staff and routine of the clinic. Provided patient with RADIATION THERAPY AND YOU handbook then, reviewed pertinent information. Educated patient reference potential side effects and management such as fatigue, diarrhea and urinary/bladder changes. Answered all patient questions to the best of my ability. Provided patient with my business card and encouraged him to call with future needs. Patient verbalized understanding of all reviewed.  

## 2020-04-27 ENCOUNTER — Other Ambulatory Visit: Payer: Self-pay

## 2020-04-27 ENCOUNTER — Ambulatory Visit
Admission: RE | Admit: 2020-04-27 | Discharge: 2020-04-27 | Disposition: A | Discharge status: Home / Self Care | Payer: Medicare Other | Source: Ambulatory Visit | Attending: Radiation Oncology | Admitting: Radiation Oncology

## 2020-04-27 DIAGNOSIS — C61 Malignant neoplasm of prostate: Secondary | ICD-10-CM | POA: Diagnosis not present

## 2020-04-28 ENCOUNTER — Other Ambulatory Visit: Payer: Self-pay

## 2020-04-28 ENCOUNTER — Ambulatory Visit
Admission: RE | Admit: 2020-04-28 | Discharge: 2020-04-28 | Disposition: A | Discharge status: Home / Self Care | Payer: Medicare Other | Source: Ambulatory Visit | Attending: Radiation Oncology | Admitting: Radiation Oncology

## 2020-04-28 DIAGNOSIS — C61 Malignant neoplasm of prostate: Secondary | ICD-10-CM | POA: Diagnosis not present

## 2020-04-29 ENCOUNTER — Other Ambulatory Visit: Payer: Self-pay

## 2020-04-29 ENCOUNTER — Ambulatory Visit
Admission: RE | Admit: 2020-04-29 | Discharge: 2020-04-29 | Disposition: A | Discharge status: Home / Self Care | Payer: Medicare Other | Source: Ambulatory Visit | Attending: Radiation Oncology | Admitting: Radiation Oncology

## 2020-04-29 DIAGNOSIS — C61 Malignant neoplasm of prostate: Secondary | ICD-10-CM | POA: Diagnosis not present

## 2020-04-30 ENCOUNTER — Other Ambulatory Visit: Payer: Self-pay

## 2020-04-30 ENCOUNTER — Ambulatory Visit
Admission: RE | Admit: 2020-04-30 | Discharge: 2020-04-30 | Disposition: A | Discharge status: Home / Self Care | Payer: Medicare Other | Source: Ambulatory Visit | Attending: Radiation Oncology | Admitting: Radiation Oncology

## 2020-04-30 DIAGNOSIS — C61 Malignant neoplasm of prostate: Secondary | ICD-10-CM | POA: Diagnosis not present

## 2020-05-01 ENCOUNTER — Other Ambulatory Visit: Payer: Self-pay

## 2020-05-01 ENCOUNTER — Ambulatory Visit
Admission: RE | Admit: 2020-05-01 | Discharge: 2020-05-01 | Disposition: A | Discharge status: Home / Self Care | Payer: Medicare Other | Source: Ambulatory Visit | Attending: Radiation Oncology | Admitting: Radiation Oncology

## 2020-05-01 DIAGNOSIS — C61 Malignant neoplasm of prostate: Secondary | ICD-10-CM | POA: Diagnosis not present

## 2020-05-04 ENCOUNTER — Ambulatory Visit
Admission: RE | Admit: 2020-05-04 | Discharge: 2020-05-04 | Disposition: A | Discharge status: Home / Self Care | Payer: Medicare Other | Source: Ambulatory Visit | Attending: Radiation Oncology | Admitting: Radiation Oncology

## 2020-05-04 ENCOUNTER — Other Ambulatory Visit: Payer: Self-pay

## 2020-05-04 DIAGNOSIS — C61 Malignant neoplasm of prostate: Secondary | ICD-10-CM | POA: Diagnosis not present

## 2020-05-05 ENCOUNTER — Encounter: Payer: Self-pay | Admitting: Medical Oncology

## 2020-05-05 ENCOUNTER — Other Ambulatory Visit: Payer: Self-pay

## 2020-05-05 ENCOUNTER — Ambulatory Visit
Admission: RE | Admit: 2020-05-05 | Discharge: 2020-05-05 | Disposition: A | Discharge status: Home / Self Care | Payer: Medicare Other | Source: Ambulatory Visit | Attending: Radiation Oncology | Admitting: Radiation Oncology

## 2020-05-05 DIAGNOSIS — C61 Malignant neoplasm of prostate: Secondary | ICD-10-CM | POA: Diagnosis not present

## 2020-05-05 NOTE — Progress Notes (Signed)
Spoke with wife to see how radiation is going for Evan Mitchell. She states he is doing well but some late afternoon fatigue. I asked how he is mentally doing and she state much better. Since starting Lexapro and starting treatment, he is back to being his old self. I will continue to follow and asked them to call me with questions or concerns. She was very appreciative of the call and voiced understanding.

## 2020-05-06 ENCOUNTER — Other Ambulatory Visit: Payer: Self-pay

## 2020-05-06 ENCOUNTER — Ambulatory Visit
Admission: RE | Admit: 2020-05-06 | Discharge: 2020-05-06 | Disposition: A | Discharge status: Home / Self Care | Payer: Medicare Other | Source: Ambulatory Visit | Attending: Radiation Oncology | Admitting: Radiation Oncology

## 2020-05-06 DIAGNOSIS — C61 Malignant neoplasm of prostate: Secondary | ICD-10-CM | POA: Diagnosis not present

## 2020-05-07 ENCOUNTER — Ambulatory Visit
Admission: RE | Admit: 2020-05-07 | Discharge: 2020-05-07 | Disposition: A | Discharge status: Home / Self Care | Payer: Medicare Other | Source: Ambulatory Visit | Attending: Radiation Oncology | Admitting: Radiation Oncology

## 2020-05-07 ENCOUNTER — Other Ambulatory Visit: Payer: Self-pay

## 2020-05-07 DIAGNOSIS — C61 Malignant neoplasm of prostate: Secondary | ICD-10-CM | POA: Diagnosis not present

## 2020-05-08 ENCOUNTER — Ambulatory Visit
Admission: RE | Admit: 2020-05-08 | Discharge: 2020-05-08 | Disposition: A | Discharge status: Home / Self Care | Payer: Medicare Other | Source: Ambulatory Visit | Attending: Radiation Oncology | Admitting: Radiation Oncology

## 2020-05-08 ENCOUNTER — Encounter: Payer: Self-pay | Admitting: Genetic Counselor

## 2020-05-08 ENCOUNTER — Other Ambulatory Visit: Payer: Self-pay

## 2020-05-08 DIAGNOSIS — C61 Malignant neoplasm of prostate: Secondary | ICD-10-CM | POA: Diagnosis not present

## 2020-05-11 ENCOUNTER — Other Ambulatory Visit: Payer: Self-pay

## 2020-05-11 ENCOUNTER — Ambulatory Visit
Admission: RE | Admit: 2020-05-11 | Discharge: 2020-05-11 | Disposition: A | Discharge status: Home / Self Care | Payer: Medicare Other | Source: Ambulatory Visit | Attending: Radiation Oncology | Admitting: Radiation Oncology

## 2020-05-11 ENCOUNTER — Other Ambulatory Visit: Payer: Self-pay | Admitting: Medical Oncology

## 2020-05-11 DIAGNOSIS — C61 Malignant neoplasm of prostate: Secondary | ICD-10-CM

## 2020-05-12 ENCOUNTER — Other Ambulatory Visit: Payer: Self-pay

## 2020-05-12 ENCOUNTER — Ambulatory Visit
Admission: RE | Admit: 2020-05-12 | Discharge: 2020-05-12 | Disposition: A | Discharge status: Home / Self Care | Payer: Medicare Other | Source: Ambulatory Visit | Attending: Radiation Oncology | Admitting: Radiation Oncology

## 2020-05-12 DIAGNOSIS — C61 Malignant neoplasm of prostate: Secondary | ICD-10-CM | POA: Diagnosis not present

## 2020-05-13 ENCOUNTER — Ambulatory Visit
Admission: RE | Admit: 2020-05-13 | Discharge: 2020-05-13 | Disposition: A | Discharge status: Home / Self Care | Payer: Medicare Other | Source: Ambulatory Visit | Attending: Radiation Oncology | Admitting: Radiation Oncology

## 2020-05-13 ENCOUNTER — Other Ambulatory Visit: Payer: Self-pay

## 2020-05-13 DIAGNOSIS — C61 Malignant neoplasm of prostate: Secondary | ICD-10-CM | POA: Diagnosis not present

## 2020-05-14 ENCOUNTER — Ambulatory Visit
Admission: RE | Admit: 2020-05-14 | Discharge: 2020-05-14 | Disposition: A | Discharge status: Home / Self Care | Payer: Medicare Other | Source: Ambulatory Visit | Attending: Radiation Oncology | Admitting: Radiation Oncology

## 2020-05-14 ENCOUNTER — Other Ambulatory Visit: Payer: Self-pay

## 2020-05-14 DIAGNOSIS — C61 Malignant neoplasm of prostate: Secondary | ICD-10-CM | POA: Diagnosis not present

## 2020-05-15 ENCOUNTER — Ambulatory Visit
Admission: RE | Admit: 2020-05-15 | Discharge: 2020-05-15 | Disposition: A | Discharge status: Home / Self Care | Payer: Medicare Other | Source: Ambulatory Visit | Attending: Radiation Oncology | Admitting: Radiation Oncology

## 2020-05-15 ENCOUNTER — Other Ambulatory Visit: Payer: Self-pay

## 2020-05-15 DIAGNOSIS — C61 Malignant neoplasm of prostate: Secondary | ICD-10-CM | POA: Diagnosis not present

## 2020-05-18 ENCOUNTER — Other Ambulatory Visit: Payer: Self-pay

## 2020-05-18 ENCOUNTER — Ambulatory Visit
Admission: RE | Admit: 2020-05-18 | Discharge: 2020-05-18 | Disposition: A | Discharge status: Home / Self Care | Payer: Medicare Other | Source: Ambulatory Visit | Attending: Radiation Oncology | Admitting: Radiation Oncology

## 2020-05-18 DIAGNOSIS — C61 Malignant neoplasm of prostate: Secondary | ICD-10-CM | POA: Diagnosis not present

## 2020-05-19 ENCOUNTER — Ambulatory Visit
Admission: RE | Admit: 2020-05-19 | Discharge: 2020-05-19 | Disposition: A | Discharge status: Home / Self Care | Payer: Medicare Other | Source: Ambulatory Visit | Attending: Radiation Oncology | Admitting: Radiation Oncology

## 2020-05-19 ENCOUNTER — Other Ambulatory Visit: Payer: Self-pay

## 2020-05-19 DIAGNOSIS — C61 Malignant neoplasm of prostate: Secondary | ICD-10-CM | POA: Diagnosis not present

## 2020-05-20 ENCOUNTER — Ambulatory Visit
Admission: RE | Admit: 2020-05-20 | Discharge: 2020-05-20 | Disposition: A | Discharge status: Home / Self Care | Payer: Medicare Other | Source: Ambulatory Visit | Attending: Radiation Oncology | Admitting: Radiation Oncology

## 2020-05-20 ENCOUNTER — Other Ambulatory Visit: Payer: Self-pay

## 2020-05-20 DIAGNOSIS — C61 Malignant neoplasm of prostate: Secondary | ICD-10-CM | POA: Insufficient documentation

## 2020-05-21 ENCOUNTER — Ambulatory Visit
Admission: RE | Admit: 2020-05-21 | Discharge: 2020-05-21 | Disposition: A | Discharge status: Home / Self Care | Payer: Medicare Other | Source: Ambulatory Visit | Attending: Radiation Oncology | Admitting: Radiation Oncology

## 2020-05-21 DIAGNOSIS — C61 Malignant neoplasm of prostate: Secondary | ICD-10-CM | POA: Diagnosis not present

## 2020-05-22 ENCOUNTER — Ambulatory Visit
Admission: RE | Admit: 2020-05-22 | Discharge: 2020-05-22 | Disposition: A | Discharge status: Home / Self Care | Payer: Medicare Other | Source: Ambulatory Visit | Attending: Radiation Oncology | Admitting: Radiation Oncology

## 2020-05-22 ENCOUNTER — Other Ambulatory Visit: Payer: Self-pay

## 2020-05-22 DIAGNOSIS — C61 Malignant neoplasm of prostate: Secondary | ICD-10-CM | POA: Diagnosis not present

## 2020-05-26 ENCOUNTER — Ambulatory Visit
Admission: RE | Admit: 2020-05-26 | Discharge: 2020-05-26 | Disposition: A | Discharge status: Home / Self Care | Payer: Medicare Other | Source: Ambulatory Visit | Attending: Radiation Oncology | Admitting: Radiation Oncology

## 2020-05-26 ENCOUNTER — Other Ambulatory Visit: Payer: Self-pay

## 2020-05-26 DIAGNOSIS — C61 Malignant neoplasm of prostate: Secondary | ICD-10-CM | POA: Diagnosis not present

## 2020-05-27 ENCOUNTER — Ambulatory Visit
Admission: RE | Admit: 2020-05-27 | Discharge: 2020-05-27 | Disposition: A | Discharge status: Home / Self Care | Payer: Medicare Other | Source: Ambulatory Visit | Attending: Radiation Oncology | Admitting: Radiation Oncology

## 2020-05-27 ENCOUNTER — Other Ambulatory Visit: Payer: Self-pay

## 2020-05-27 DIAGNOSIS — C61 Malignant neoplasm of prostate: Secondary | ICD-10-CM | POA: Diagnosis not present

## 2020-05-28 ENCOUNTER — Ambulatory Visit
Admission: RE | Admit: 2020-05-28 | Discharge: 2020-05-28 | Disposition: A | Discharge status: Home / Self Care | Payer: Medicare Other | Source: Ambulatory Visit | Attending: Radiation Oncology | Admitting: Radiation Oncology

## 2020-05-28 ENCOUNTER — Other Ambulatory Visit: Payer: Self-pay

## 2020-05-28 DIAGNOSIS — C61 Malignant neoplasm of prostate: Secondary | ICD-10-CM | POA: Diagnosis not present

## 2020-05-29 ENCOUNTER — Other Ambulatory Visit: Payer: Self-pay

## 2020-05-29 ENCOUNTER — Ambulatory Visit
Admission: RE | Admit: 2020-05-29 | Discharge: 2020-05-29 | Disposition: A | Discharge status: Home / Self Care | Payer: Medicare Other | Source: Ambulatory Visit | Attending: Radiation Oncology | Admitting: Radiation Oncology

## 2020-05-29 DIAGNOSIS — C61 Malignant neoplasm of prostate: Secondary | ICD-10-CM | POA: Diagnosis not present

## 2020-06-01 ENCOUNTER — Ambulatory Visit
Admission: RE | Admit: 2020-06-01 | Discharge: 2020-06-01 | Disposition: A | Discharge status: Home / Self Care | Payer: Medicare Other | Source: Ambulatory Visit | Attending: Radiation Oncology | Admitting: Radiation Oncology

## 2020-06-01 ENCOUNTER — Other Ambulatory Visit: Payer: Self-pay

## 2020-06-01 DIAGNOSIS — C61 Malignant neoplasm of prostate: Secondary | ICD-10-CM | POA: Diagnosis not present

## 2020-06-02 ENCOUNTER — Other Ambulatory Visit: Payer: Self-pay

## 2020-06-02 ENCOUNTER — Ambulatory Visit
Admission: RE | Admit: 2020-06-02 | Discharge: 2020-06-02 | Disposition: A | Discharge status: Home / Self Care | Payer: Medicare Other | Source: Ambulatory Visit | Attending: Radiation Oncology | Admitting: Radiation Oncology

## 2020-06-02 ENCOUNTER — Encounter: Payer: Self-pay | Admitting: Urology

## 2020-06-02 DIAGNOSIS — C61 Malignant neoplasm of prostate: Secondary | ICD-10-CM | POA: Diagnosis not present

## 2020-06-08 DIAGNOSIS — H11041 Peripheral pterygium, stationary, right eye: Secondary | ICD-10-CM | POA: Diagnosis not present

## 2020-06-08 DIAGNOSIS — H16223 Keratoconjunctivitis sicca, not specified as Sjogren's, bilateral: Secondary | ICD-10-CM | POA: Diagnosis not present

## 2020-06-08 DIAGNOSIS — B394 Histoplasmosis capsulati, unspecified: Secondary | ICD-10-CM | POA: Diagnosis not present

## 2020-06-08 DIAGNOSIS — Z961 Presence of intraocular lens: Secondary | ICD-10-CM | POA: Diagnosis not present

## 2020-06-20 DIAGNOSIS — Z23 Encounter for immunization: Secondary | ICD-10-CM | POA: Diagnosis not present

## 2020-06-25 DIAGNOSIS — C61 Malignant neoplasm of prostate: Secondary | ICD-10-CM | POA: Diagnosis not present

## 2020-06-25 DIAGNOSIS — Z0120 Encounter for dental examination and cleaning without abnormal findings: Secondary | ICD-10-CM | POA: Diagnosis not present

## 2020-07-01 DIAGNOSIS — C61 Malignant neoplasm of prostate: Secondary | ICD-10-CM | POA: Diagnosis not present

## 2020-07-01 DIAGNOSIS — Z8551 Personal history of malignant neoplasm of bladder: Secondary | ICD-10-CM | POA: Diagnosis not present

## 2020-07-01 NOTE — Progress Notes (Signed)
Radiation Oncology         [phone] ________________________________  Name: Evan Mitchell MRN: [id number]  Date: 07/02/2020  DOB: 1945/10/10  Post Treatment Note  CC: Prince Solian, MD  Prince Solian, MD  Diagnosis:   74 y.o. gentleman with stage T2a adenocarcinoma of the prostate with a Gleason's score of 4+3 and a PSA of 11.3  Interval Since Last Radiation:  4 weeks  04/23/20 - 06/02/20; concurrent with ADT:  The prostate was treated to 70 Gy in 28 fractions of 2.5 Gy  Narrative:  I spoke with the patient to conduct his routine scheduled 1 month follow up visit via telephone to spare the patient unnecessary potential exposure in the healthcare setting during the current COVID-19 pandemic.  The patient was notified in advance and gave permission to proceed with this visit format. He tolerated radiation treatment relatively well with only minor urinary irritation and modest fatigue.  He did experience mild dysuria, nocturia 4-5 times per night, and increased urgency, frequency.  He specifically denied gross hematuria, straining to void or incontinence.  He did experience some occasional diarrhea which was relieved with Imodium as needed.                              On review of systems, the patient states that he is doing very well in general.  He has had significant improvement in his LUTS with the current IPSS of 6 which is pretty much back to his baseline.  He specifically denies dysuria, gross hematuria, straining to void, incomplete emptying or incontinence.  He has not had any further bowel issues and reports a healthy appetite, maintaining his weight.  He continues with moderate fatigue associated with his ADT.  His last Firmagon injection was given in July 2021 and recent labs from 06/25/2020 showed an excellent response with his PSA decreased to 0.36. His testosterone remains significantly suppressed at 11.3, contributing to the fatigue and decreased libido.  He also got an  excellent report from his cystoscopy procedure on 07/01/2020 which was without any evidence of recurrence of his bladder cancer.  He is scheduled to start maintenance BCG x3 on 07/17/2020 and then will see Dr. Jeffie Pollock approximately 3 months later.  ALLERGIES:  is allergic to aleve [naproxen sodium], codeine, and tramadol.  Meds: Current Outpatient Medications  Medication Sig Dispense Refill  . acetaminophen (TYLENOL) 650 MG CR tablet Take 1,300 mg by mouth at bedtime as needed for pain.    Marland Kitchen allopurinol (ZYLOPRIM) 300 MG tablet Take 300 mg by mouth daily.     Marland Kitchen aspirin EC 81 MG tablet Take 81 mg by mouth daily.    . Calcium Carbonate-Vitamin D (CALCIUM + D PO) Take 2 tablets by mouth daily with breakfast.     . carvedilol (COREG) 25 MG tablet Take 1 tablet (25 mg total) by mouth 2 (two) times daily with a meal. 180 tablet 3  . GLUCOSAMINE-CHONDROITIN PO Take 1 tablet by mouth 3 (three) times daily with meals.     . hydrochlorothiazide (HYDRODIURIL) 25 MG tablet TAKE 1 TABLET (25 MG TOTAL) BY MOUTH DAILY. (Patient taking differently: Take 25 mg by mouth daily. ) 90 tablet 2  . lisinopril (PRINIVIL,ZESTRIL) 40 MG tablet Take 40 mg by mouth daily.    . Melatonin 3 MG TABS Take 6 mg by mouth at bedtime.    . Multiple Vitamins-Minerals (PRESERVISION AREDS PO) Take 1 tablet by mouth  2 (two) times daily. Breakfast & supper    . NON FORMULARY Elderberry gummies    . Omega-3 Fatty Acids (FISH OIL PO) Take 1 tablet by mouth 3 (three) times daily with meals.     . simvastatin (ZOCOR) 40 MG tablet Take 40 mg by mouth daily with supper.     . vitamin C (ASCORBIC ACID) 500 MG tablet Take 500 mg by mouth daily with breakfast.      No current facility-administered medications for this visit.   Facility-Administered Medications Ordered in Other Visits  Medication Dose Route Frequency Provider Last Rate Last Admin  . gemcitabine (GEMZAR) chemo syringe for bladder instillation 2,000 mg  2,000 mg Bladder  Instillation Once Irine Seal, MD        Physical Findings:  vitals were not taken for this visit.   /10 Unable to assess due to telephone follow-up visit format.  Lab Findings: Lab Results  Component Value Date   WBC 5.6 10/28/2019   HGB 16.4 10/28/2019   HCT 51.1 10/28/2019   MCV 91.1 10/28/2019   PLT 130 (L) 10/28/2019     Radiographic Findings: No results found.  Impression/Plan: 1. 74 y.o. gentleman with stage T2a adenocarcinoma of the prostate with a Gleason's score of 4+3 and a PSA of 11.3. He will continue to follow up with urology for ongoing PSA determinations and had a recent follow up appointment with Dr. Jeffie Pollock on 07/01/2020. He understands what to expect with regards to PSA monitoring going forward. I will look forward to following his response to treatment via correspondence with urology, and would be happy to continue to participate in his care if clinically indicated. I talked to the patient about what to expect in the future, including his risk for erectile dysfunction and rectal bleeding. I encouraged him to call or return to the office if he has any questions regarding his previous radiation or possible radiation side effects. He was comfortable with this plan and will follow up as needed.    Nicholos Johns, PA-C

## 2020-07-01 NOTE — Progress Notes (Signed)
  Radiation Oncology         [phone] ________________________________  Name: Evan Mitchell MRN: [id number]  Date: 06/02/2020  DOB: [birth year]  End of Treatment Note  Diagnosis:   74 y.o. gentleman with stage T2a adenocarcinoma of the prostate with a Gleason's score of 4+3 and a PSA of 11.3     Indication for treatment:  Curative, Definitive Radiotherapy       Radiation treatment dates:   04/23/20 - 06/02/20; concurrent with ADT  Site/dose:   The prostate was treated to 70 Gy in 28 fractions of 2.5 Gy  Beams/energy:   The patient was treated with IMRT using volumetric arc therapy delivering 6 MV X-rays to clockwise and counterclockwise circumferential arcs with a 90 degree collimator offset to avoid dose scalloping.  Image guidance was performed with daily cone beam CT prior to each fraction to align to gold markers in the prostate and assure proper bladder and rectal fill volumes.  Immobilization was achieved with BodyFix custom mold.  Narrative: The patient tolerated radiation treatment relatively well with only minor urinary irritation and modest fatigue.  He did experience mild dysuria, nocturia 4-5 times per night, and increased urgency, frequency.  He specifically denied gross hematuria, straining to void or incontinence.  He did experience some occasional diarrhea which was relieved with Imodium as needed.  Plan: The patient has completed radiation treatment. He will return to radiation oncology clinic for routine followup in one month. I advised him to call or return sooner if he has any questions or concerns related to his recovery or treatment. ________________________________  Sheral Apley. Tammi Klippel, M.D.

## 2020-07-02 ENCOUNTER — Ambulatory Visit
Admission: RE | Admit: 2020-07-02 | Discharge: 2020-07-02 | Disposition: A | Discharge status: Home / Self Care | Payer: Medicare Other | Source: Ambulatory Visit | Attending: Urology | Admitting: Urology

## 2020-07-02 ENCOUNTER — Other Ambulatory Visit: Payer: Self-pay

## 2020-07-02 ENCOUNTER — Encounter: Payer: Self-pay | Admitting: Urology

## 2020-07-02 DIAGNOSIS — C61 Malignant neoplasm of prostate: Secondary | ICD-10-CM

## 2020-07-02 NOTE — Progress Notes (Signed)
Patients meaningful use is complete. Patient Wife states that he does not have any dysuria or hematuria. States that the patient empties his bladder with urination. States that the patient has some urgency with urination. Reports nocturia x2. Reports a stream is that is strong to moderate. Patients last appointment with urologist was yesterday.Ipps was 6

## 2020-07-14 DIAGNOSIS — R7301 Impaired fasting glucose: Secondary | ICD-10-CM | POA: Diagnosis not present

## 2020-07-14 DIAGNOSIS — M109 Gout, unspecified: Secondary | ICD-10-CM | POA: Diagnosis not present

## 2020-07-14 DIAGNOSIS — Z125 Encounter for screening for malignant neoplasm of prostate: Secondary | ICD-10-CM | POA: Diagnosis not present

## 2020-07-14 DIAGNOSIS — E781 Pure hyperglyceridemia: Secondary | ICD-10-CM | POA: Diagnosis not present

## 2020-07-18 ENCOUNTER — Ambulatory Visit: Payer: Medicare Other | Attending: Internal Medicine

## 2020-07-18 DIAGNOSIS — Z23 Encounter for immunization: Secondary | ICD-10-CM

## 2020-07-18 NOTE — Progress Notes (Signed)
° °  Covid-19 Vaccination Clinic  Name:  Evan Mitchell    MRN: [id number] DOB: March 18, 1946  07/18/2020  Evan Mitchell was observed post Covid-19 immunization for 15 minutes without incident. He was provided with Vaccine Information Sheet and instruction to access the V-Safe system.   Evan Mitchell was instructed to call 911 with any severe reactions post vaccine:  Difficulty breathing   Swelling of face and throat   A fast heartbeat   A bad rash all over body   Dizziness and weakness

## 2020-07-22 DIAGNOSIS — R82998 Other abnormal findings in urine: Secondary | ICD-10-CM | POA: Diagnosis not present

## 2020-07-22 DIAGNOSIS — R7301 Impaired fasting glucose: Secondary | ICD-10-CM | POA: Diagnosis not present

## 2020-07-22 DIAGNOSIS — I10 Essential (primary) hypertension: Secondary | ICD-10-CM | POA: Diagnosis not present

## 2020-07-22 DIAGNOSIS — C61 Malignant neoplasm of prostate: Secondary | ICD-10-CM | POA: Diagnosis not present

## 2020-07-22 DIAGNOSIS — Z0000 Encounter for general adult medical examination without abnormal findings: Secondary | ICD-10-CM | POA: Diagnosis not present

## 2020-07-27 DIAGNOSIS — D225 Melanocytic nevi of trunk: Secondary | ICD-10-CM | POA: Diagnosis not present

## 2020-07-27 DIAGNOSIS — D2271 Melanocytic nevi of right lower limb, including hip: Secondary | ICD-10-CM | POA: Diagnosis not present

## 2020-07-27 DIAGNOSIS — Z85828 Personal history of other malignant neoplasm of skin: Secondary | ICD-10-CM | POA: Diagnosis not present

## 2020-07-27 DIAGNOSIS — L570 Actinic keratosis: Secondary | ICD-10-CM | POA: Diagnosis not present

## 2020-08-01 NOTE — Progress Notes (Signed)
[name] Date of Birth: August 20, 1946 Medical Record #[id number]  History of Present Illness: Evan Mitchell is seen today for followup of his hypertension and hyperlipidemia. He has a chronic LBBB.   Since his last visit he has been diagnosed with Prostate and bladder CA. Treated with BCG irrigations and RT/hormonal therapy. It's been a tough year but he is finally feeling better. He is walking at least 30 minutes a day and still working. BP readings at home have been well controlled.    Current Outpatient Medications on File Prior to Visit  Medication Sig Dispense Refill  . acetaminophen (TYLENOL) 650 MG CR tablet Take 1,300 mg by mouth at bedtime as needed for pain.    Marland Kitchen allopurinol (ZYLOPRIM) 300 MG tablet Take 300 mg by mouth daily.     . Calcium Carbonate-Vitamin D (CALCIUM + D PO) Take 2 tablets by mouth daily with breakfast.     . carvedilol (COREG) 25 MG tablet Take 1 tablet (25 mg total) by mouth 2 (two) times daily with a meal. 180 tablet 3  . escitalopram (LEXAPRO) 5 MG tablet Take 5 mg by mouth daily.    Marland Kitchen GLUCOSAMINE-CHONDROITIN PO Take 1 tablet by mouth 3 (three) times daily with meals.     . hydrochlorothiazide (HYDRODIURIL) 25 MG tablet TAKE 1 TABLET (25 MG TOTAL) BY MOUTH DAILY. (Patient taking differently: Take 25 mg by mouth daily. ) 90 tablet 2  . lisinopril (PRINIVIL,ZESTRIL) 40 MG tablet Take 40 mg by mouth daily.    . Melatonin 3 MG TABS Take 6 mg by mouth at bedtime.    . Multiple Vitamins-Minerals (PRESERVISION AREDS PO) Take 1 tablet by mouth 2 (two) times daily. Breakfast & supper    . NON FORMULARY Elderberry gummies    . Omega-3 Fatty Acids (FISH OIL PO) Take 1 tablet by mouth 3 (three) times daily with meals.     . simvastatin (ZOCOR) 40 MG tablet Take 40 mg by mouth daily with supper.     . vitamin C (ASCORBIC ACID) 500 MG tablet Take 500 mg by mouth daily with breakfast.      Current Facility-Administered Medications on File Prior to Visit  Medication Dose  Route Frequency Provider Last Rate Last Admin  . gemcitabine (GEMZAR) chemo syringe for bladder instillation 2,000 mg  2,000 mg Bladder Instillation Once Irine Seal, MD        Allergies  Allergen Reactions  . Aleve [Naproxen Sodium] Swelling    Fluid retention  . Codeine     nausea  . Tramadol     Intolerance. Causes lightheadedness, dizziness, and unsteady gait.    Past Medical History:  Diagnosis Date  . Allergic rhinitis   . Anxiety   . Arthritis    hand and feet  . History of syncope 10/2017  . HTN (hypertension)   . Hypertriglyceridemia    Mild  . Impaired fasting glucose   . Inguinal hernia    Left  . LBBB (left bundle branch block) 12/12/2012  . PONV (postoperative nausea and vomiting)    nausea,dizziness after anesthesia  . Pre-diabetes    control with diet and exercise  . Prostate cancer ([place])   . Skin cancer 2018   basal and squamous cell carcinoma removed and chemotherapy cream applied.    Past Surgical History:  Procedure Laterality Date  . COLONOSCOPY    . HERNIA REPAIR  1995  . INGUINAL HERNIA REPAIR Left 02/28/2019   Procedure: LEFT INGUINAL HERNIA REPAIR WITH MESH;  Surgeon: Coralie Keens, MD;  Location: [facility];  Service: General;  Laterality: Left;  . TONSILLECTOMY  1947  . TRANSURETHRAL RESECTION OF BLADDER TUMOR N/A 10/29/2019   Procedure: TRANSURETHRAL RESECTION OF BLADDER TUMOR (TURBT)  INSTILL GEMCITABINE;  Surgeon: Irine Seal, MD;  Location: [facility];  Service: Urology;  Laterality: N/A;    Social History   Tobacco Use  Smoking Status Former Smoker  . Packs/day: 2.00  . Years: 20.00  . Pack years: 40.00  . Types: Cigarettes  . Quit date: 09/19/1976  . Years since quitting: 43.9  Smokeless Tobacco Never Used    Social History   Substance and Sexual Activity  Alcohol Use No  . Alcohol/week: 0.0 standard drinks    Family History  Problem Relation Age of Onset  . Heart attack Mother   . Skin cancer Mother   .  Diabetes Mother   . Hypertension Mother   . Melanoma Mother   . Cancer Maternal Grandfather        throat/lung cancer  . Melanoma Sister   . Melanoma Sister   . Prostate cancer Brother 54  . Colon cancer Neg Hx   . Breast cancer Neg Hx   . Pancreatic cancer Neg Hx     Review of Systems: As noted in history of present illness.  All other systems were reviewed and are negative.  Physical Exam: BP (!) 145/81   Pulse 83   Temp (!) 95.7 F (35.4 C)   Ht 6' (1.829 m)   Wt 221 lb 9.6 oz (100.5 kg)   SpO2 97%   BMI 30.05 kg/m  GENERAL:  Well appearing HEENT:  PERRL, EOMI, sclera are clear. Oropharynx is clear. NECK:  No jugular venous distention, carotid upstroke brisk and symmetric, no bruits, no thyromegaly or adenopathy LUNGS:  Clear to auscultation bilaterally CHEST:  Unremarkable HEART:  RRR,  PMI not displaced or sustained,S1 and S2 within normal limits, no S3, no S4: no clicks, no rubs, no murmurs ABD:  Soft, nontender. BS +, no masses or bruits. No hepatomegaly, no splenomegaly EXT:  2 + pulses throughout, no edema, no cyanosis no clubbing SKIN:  Warm and dry.  No rashes NEURO:  Alert and oriented x 3. Cranial nerves II through XII intact. PSYCH:  Cognitively intact   LABORATORY DATA:  Lab Results  Component Value Date   WBC 5.6 10/28/2019   HGB 16.4 10/28/2019   HCT 51.1 10/28/2019   PLT 130 (L) 10/28/2019   GLUCOSE 120 (H) 10/28/2019   ALT 46 (H) 11/30/2018   AST 38 11/30/2018   NA 141 10/28/2019   K 3.8 10/28/2019   CL 105 10/28/2019   CREATININE 0.86 10/28/2019   BUN 16 10/28/2019   CO2 28 10/28/2019   HGBA1C 6.1 (H) 10/28/2019   Labs reviewed from primary care: 02/09/17: cholesterol 121, triglycerides 106, HDL 42, LDL 58. Glucose 113. Other chemistries, CBC, TSH normal. PSA 7.44. A1c 5.7% Dated 03/29/19: cholesterol 131, triglycerides 90, HDL 43, LDL 70. A1c 5.7%. Normal CMET, CBC, TSH  Ecg today shows NSR with rate 83, LBBB. I have personally reviewed  and interpreted this study.  Assessment / Plan: 1. Hypertension. Blood pressure is  well controlled on combination of carvedilol, lisinopril, and HCTZ.   2. Left bundle branch block- intermittent. No ischemia noted on prior stress testing. Ejection fraction was normal by prior echo.   3. Hypercholesterolemia. On statin with excellent results.   Follow up in one year

## 2020-08-03 ENCOUNTER — Encounter: Payer: Self-pay | Admitting: Cardiology

## 2020-08-03 ENCOUNTER — Ambulatory Visit (INDEPENDENT_AMBULATORY_CARE_PROVIDER_SITE_OTHER): Payer: Medicare Other | Admitting: Cardiology

## 2020-08-03 VITALS — BP 145/81 | HR 83 | Temp 95.7°F | Ht 72.0 in | Wt 221.6 lb

## 2020-08-03 DIAGNOSIS — I447 Left bundle-branch block, unspecified: Secondary | ICD-10-CM

## 2020-08-03 DIAGNOSIS — I10 Essential (primary) hypertension: Secondary | ICD-10-CM | POA: Diagnosis not present

## 2020-08-03 DIAGNOSIS — E7800 Pure hypercholesterolemia, unspecified: Secondary | ICD-10-CM

## 2020-08-03 NOTE — Patient Instructions (Signed)
Stop taking ASA  Continue your other therapy  Follow up in one year

## 2020-08-21 DIAGNOSIS — Z1212 Encounter for screening for malignant neoplasm of rectum: Secondary | ICD-10-CM | POA: Diagnosis not present

## 2020-09-23 DIAGNOSIS — C61 Malignant neoplasm of prostate: Secondary | ICD-10-CM | POA: Diagnosis not present

## 2020-09-30 DIAGNOSIS — Z8551 Personal history of malignant neoplasm of bladder: Secondary | ICD-10-CM | POA: Diagnosis not present

## 2020-09-30 DIAGNOSIS — Z8546 Personal history of malignant neoplasm of prostate: Secondary | ICD-10-CM | POA: Diagnosis not present

## 2020-11-04 DIAGNOSIS — Z5111 Encounter for antineoplastic chemotherapy: Secondary | ICD-10-CM | POA: Diagnosis not present

## 2020-11-04 DIAGNOSIS — C670 Malignant neoplasm of trigone of bladder: Secondary | ICD-10-CM | POA: Diagnosis not present

## 2020-11-11 DIAGNOSIS — Z5111 Encounter for antineoplastic chemotherapy: Secondary | ICD-10-CM | POA: Diagnosis not present

## 2020-11-11 DIAGNOSIS — C670 Malignant neoplasm of trigone of bladder: Secondary | ICD-10-CM | POA: Diagnosis not present

## 2020-11-18 DIAGNOSIS — Z5111 Encounter for antineoplastic chemotherapy: Secondary | ICD-10-CM | POA: Diagnosis not present

## 2020-11-18 DIAGNOSIS — R8271 Bacteriuria: Secondary | ICD-10-CM | POA: Diagnosis not present

## 2020-11-18 DIAGNOSIS — C670 Malignant neoplasm of trigone of bladder: Secondary | ICD-10-CM | POA: Diagnosis not present

## 2020-11-30 ENCOUNTER — Other Ambulatory Visit: Payer: Self-pay

## 2020-11-30 MED ORDER — CARVEDILOL 25 MG PO TABS: 25.0000 mg | ORAL_TABLET | Freq: Two times a day (BID) | ORAL | 3 refills | Status: DC

## 2020-12-01 DIAGNOSIS — Z5111 Encounter for antineoplastic chemotherapy: Secondary | ICD-10-CM | POA: Diagnosis not present

## 2020-12-01 DIAGNOSIS — C670 Malignant neoplasm of trigone of bladder: Secondary | ICD-10-CM | POA: Diagnosis not present

## 2020-12-22 DIAGNOSIS — Z8546 Personal history of malignant neoplasm of prostate: Secondary | ICD-10-CM | POA: Diagnosis not present

## 2020-12-30 DIAGNOSIS — Z8546 Personal history of malignant neoplasm of prostate: Secondary | ICD-10-CM | POA: Diagnosis not present

## 2020-12-30 DIAGNOSIS — N5311 Retarded ejaculation: Secondary | ICD-10-CM | POA: Diagnosis not present

## 2020-12-30 DIAGNOSIS — Z8551 Personal history of malignant neoplasm of bladder: Secondary | ICD-10-CM | POA: Diagnosis not present

## 2020-12-30 DIAGNOSIS — R3915 Urgency of urination: Secondary | ICD-10-CM | POA: Diagnosis not present

## 2021-01-19 ENCOUNTER — Ambulatory Visit: Payer: Medicare Other | Attending: Internal Medicine

## 2021-01-19 ENCOUNTER — Other Ambulatory Visit: Payer: Self-pay

## 2021-01-19 ENCOUNTER — Other Ambulatory Visit (HOSPITAL_BASED_OUTPATIENT_CLINIC_OR_DEPARTMENT_OTHER): Payer: Self-pay

## 2021-01-19 DIAGNOSIS — Z23 Encounter for immunization: Secondary | ICD-10-CM

## 2021-01-19 MED ORDER — PFIZER-BIONT COVID-19 VAC-TRIS 30 MCG/0.3ML IM SUSP
INTRAMUSCULAR | 0 refills | Status: DC
  Filled 2021-01-19: qty 0.3, 1d supply, fill #0

## 2021-01-19 NOTE — Progress Notes (Signed)
   Covid-19 Vaccination Clinic  Name:  LANDIN TALLON    MRN: [id number] DOB: 08-Apr-1946  01/19/2021  Mr. Westrup was observed post Covid-19 immunization for 15 minutes without incident. He was provided with Vaccine Information Sheet and instruction to access the V-Safe system.   Mr. Blizard was instructed to call 911 with any severe reactions post vaccine: Marland Kitchen Difficulty breathing  . Swelling of face and throat  . A fast heartbeat  . A bad rash all over body  . Dizziness and weakness   Immunizations Administered    Name Date Dose VIS Date Route   PFIZER Comrnaty(Gray TOP) Covid-19 Vaccine 01/19/2021  2:04 PM 0.3 mL 08/27/2020 Intramuscular   Manufacturer: [address]   Lot: [id number]   [place]: [phone]

## 2021-01-20 DIAGNOSIS — C61 Malignant neoplasm of prostate: Secondary | ICD-10-CM | POA: Diagnosis not present

## 2021-01-20 DIAGNOSIS — E781 Pure hyperglyceridemia: Secondary | ICD-10-CM | POA: Diagnosis not present

## 2021-01-20 DIAGNOSIS — I10 Essential (primary) hypertension: Secondary | ICD-10-CM | POA: Diagnosis not present

## 2021-01-20 DIAGNOSIS — R7301 Impaired fasting glucose: Secondary | ICD-10-CM | POA: Diagnosis not present

## 2021-02-17 ENCOUNTER — Telehealth: Payer: Self-pay | Admitting: Radiology

## 2021-02-17 NOTE — Telephone Encounter (Signed)
I added him to the cancellation list

## 2021-02-17 NOTE — Telephone Encounter (Signed)
-----   Message from Rondall Allegra sent at 02/17/2021  3:21 PM EDT ----- Patient request a sooner NEW PT appt than 04/22/21 @ 10:30. Can do same day if available.

## 2021-03-02 DIAGNOSIS — Z85828 Personal history of other malignant neoplasm of skin: Secondary | ICD-10-CM | POA: Diagnosis not present

## 2021-03-02 DIAGNOSIS — L570 Actinic keratosis: Secondary | ICD-10-CM | POA: Diagnosis not present

## 2021-03-02 DIAGNOSIS — L820 Inflamed seborrheic keratosis: Secondary | ICD-10-CM | POA: Diagnosis not present

## 2021-03-09 ENCOUNTER — Encounter: Payer: Self-pay | Admitting: Specialist

## 2021-03-09 ENCOUNTER — Ambulatory Visit: Payer: Self-pay

## 2021-03-09 ENCOUNTER — Ambulatory Visit: Payer: Medicare Other | Admitting: Specialist

## 2021-03-09 ENCOUNTER — Other Ambulatory Visit: Payer: Self-pay

## 2021-03-09 VITALS — BP 173/86 | HR 67 | Ht 72.0 in | Wt 212.0 lb

## 2021-03-09 DIAGNOSIS — M546 Pain in thoracic spine: Secondary | ICD-10-CM | POA: Diagnosis not present

## 2021-03-09 DIAGNOSIS — M4813 Ankylosing hyperostosis [Forestier], cervicothoracic region: Secondary | ICD-10-CM | POA: Diagnosis not present

## 2021-03-09 DIAGNOSIS — M25641 Stiffness of right hand, not elsewhere classified: Secondary | ICD-10-CM | POA: Diagnosis not present

## 2021-03-09 MED ORDER — DICLOFENAC SODIUM 50 MG PO TBEC: 50.0000 mg | DELAYED_RELEASE_TABLET | Freq: Two times a day (BID) | ORAL | 2 refills | Status: DC

## 2021-03-09 NOTE — Progress Notes (Signed)
Office Visit Note   Patient: Evan Mitchell           Date of Birth: November 11, 1945           MRN: [id number] Visit Date: 03/09/2021              Requested by: Prince Solian, MD [address] [place],  [place] [address] PCP: Prince Solian, MD   Assessment & Plan: Visit Diagnoses:  1. Pain in thoracic spine   2. Stiffness of right hand joint   3. Forestier's disease of cervicothoracic region     Plan: Avoid bending, stooping and avoid lifting weights greater than 10 lbs. Avoid prolong standing and walking. Avoid frequent bending and stooping  No lifting greater than 10 lbs. May use ice or moist heat for pain. Weight loss is of benefit.  Follow-Up Instructions: Return in about 4 weeks (around 04/06/2021).   Orders:  Orders Placed This Encounter  Procedures   XR Thoracic Spine 2 View   CT THORACIC SPINE WO CONTRAST   Rheumatoid Factor   Antinuclear Antib (ANA)   Uric acid   Sed Rate (ESR)   Meds ordered this encounter  Medications   diclofenac (VOLTAREN) 50 MG EC tablet    Sig: Take 1 tablet (50 mg total) by mouth 2 (two) times daily.    Dispense:  60 tablet    Refill:  2      Procedures: No procedures performed   Clinical Data: No additional findings.   Subjective: Chief Complaint  Patient presents with   Middle Back - Pain    Referred by Dr. Prince Solian    HPI  Review of Systems   Objective: Vital Signs: BP (!) 173/86 (BP Location: Left Arm, Patient Position: Sitting)   Pulse 67   Ht 6' (1.829 m)   Wt 212 lb (96.2 kg)   BMI 28.75 kg/m   Physical Exam  Ortho Exam  Specialty Comments:  No specialty comments available.  Imaging: No results found.   PMFS History: Patient Active Problem List   Diagnosis Date Noted   Malignant neoplasm of prostate ([place]) 11/12/2019   LBBB (left bundle branch block) 12/12/2012   Hypertension, uncontrolled 01/10/2012   Pre-diabetes 01/10/2012   Hyperlipidemia 01/10/2012   Past Medical  History:  Diagnosis Date   Allergic rhinitis    Anxiety    Arthritis    hand and feet   History of syncope 10/2017   HTN (hypertension)    Hypertriglyceridemia    Mild   Impaired fasting glucose    Inguinal hernia    Left   LBBB (left bundle branch block) 12/12/2012   PONV (postoperative nausea and vomiting)    nausea,dizziness after anesthesia   Pre-diabetes    control with diet and exercise   Prostate cancer ([place])    Skin cancer 2018   basal and squamous cell carcinoma removed and chemotherapy cream applied.    Family History  Problem Relation Age of Onset   Heart attack Mother    Skin cancer Mother    Diabetes Mother    Hypertension Mother    Melanoma Mother    Cancer Maternal Grandfather        throat/lung cancer   Melanoma Sister    Melanoma Sister    Prostate cancer Brother 72   Colon cancer Neg Hx    Breast cancer Neg Hx    Pancreatic cancer Neg Hx     Past Surgical History:  Procedure Laterality Date  COLONOSCOPY     HERNIA REPAIR  1995   INGUINAL HERNIA REPAIR Left 02/28/2019   Procedure: LEFT INGUINAL HERNIA REPAIR WITH MESH;  Surgeon: Coralie Keens, MD;  Location: [facility];  Service: General;  Laterality: Left;   TONSILLECTOMY  1947   TRANSURETHRAL RESECTION OF BLADDER TUMOR N/A 10/29/2019   Procedure: TRANSURETHRAL RESECTION OF BLADDER TUMOR (TURBT)  INSTILL GEMCITABINE;  Surgeon: Irine Seal, MD;  Location: [facility];  Service: Urology;  Laterality: N/A;   Social History   Occupational History   Occupation: custodian    Comment: retired-part time building maintenance  Tobacco Use   Smoking status: Former    Packs/day: 2.00    Years: 20.00    Pack years: 40.00    Types: Cigarettes    Quit date: 09/19/1976    Years since quitting: 44.4   Smokeless tobacco: Never  Vaping Use   Vaping Use: Never used  Substance and Sexual Activity   Alcohol use: No    Alcohol/week: 0.0 standard drinks   Drug use: No   Sexual activity: Not  Currently

## 2021-03-09 NOTE — Patient Instructions (Signed)
Avoid bending, stooping and avoid lifting weights greater than 10 lbs. Avoid prolong standing and walking. Avoid frequent bending and stooping  No lifting greater than 10 lbs. May use ice or moist heat for pain. Weight loss is of benefit.

## 2021-03-12 LAB — SEDIMENTATION RATE: Sed Rate: 2 mm/h (ref 0–20)

## 2021-03-12 LAB — ANTI-NUCLEAR AB-TITER (ANA TITER): ANA Titer 1: 1:80 {titer} — ABNORMAL HIGH

## 2021-03-12 LAB — ANA: Anti Nuclear Antibody (ANA): POSITIVE — AB

## 2021-03-12 LAB — RHEUMATOID FACTOR: Rheumatoid fact SerPl-aCnc: <14 IU/mL (ref <14)

## 2021-03-12 LAB — URIC ACID: Uric Acid, Serum: 3.2 mg/dL — ABNORMAL LOW (ref 4.0–8.0)

## 2021-03-23 ENCOUNTER — Ambulatory Visit
Admission: RE | Admit: 2021-03-23 | Discharge: 2021-03-23 | Disposition: A | Discharge status: Home / Self Care | Payer: Medicare Other | Source: Ambulatory Visit | Attending: Specialist | Admitting: Specialist

## 2021-03-23 DIAGNOSIS — M4813 Ankylosing hyperostosis [Forestier], cervicothoracic region: Secondary | ICD-10-CM

## 2021-03-23 DIAGNOSIS — M2578 Osteophyte, vertebrae: Secondary | ICD-10-CM | POA: Diagnosis not present

## 2021-03-23 DIAGNOSIS — Z8551 Personal history of malignant neoplasm of bladder: Secondary | ICD-10-CM | POA: Diagnosis not present

## 2021-03-23 DIAGNOSIS — M546 Pain in thoracic spine: Secondary | ICD-10-CM

## 2021-03-23 DIAGNOSIS — Z8546 Personal history of malignant neoplasm of prostate: Secondary | ICD-10-CM | POA: Diagnosis not present

## 2021-03-23 DIAGNOSIS — G35 Multiple sclerosis: Secondary | ICD-10-CM | POA: Diagnosis not present

## 2021-03-24 DIAGNOSIS — Z8546 Personal history of malignant neoplasm of prostate: Secondary | ICD-10-CM | POA: Diagnosis not present

## 2021-03-31 DIAGNOSIS — Z8546 Personal history of malignant neoplasm of prostate: Secondary | ICD-10-CM | POA: Diagnosis not present

## 2021-03-31 DIAGNOSIS — Z8551 Personal history of malignant neoplasm of bladder: Secondary | ICD-10-CM | POA: Diagnosis not present

## 2021-03-31 DIAGNOSIS — N281 Cyst of kidney, acquired: Secondary | ICD-10-CM | POA: Diagnosis not present

## 2021-04-15 ENCOUNTER — Other Ambulatory Visit: Payer: Self-pay

## 2021-04-15 ENCOUNTER — Ambulatory Visit: Payer: Medicare Other | Admitting: Specialist

## 2021-04-15 ENCOUNTER — Encounter: Payer: Self-pay | Admitting: Specialist

## 2021-04-15 VITALS — BP 183/89 | HR 80 | Ht 72.0 in | Wt 212.0 lb

## 2021-04-15 DIAGNOSIS — M4813 Ankylosing hyperostosis [Forestier], cervicothoracic region: Secondary | ICD-10-CM

## 2021-04-15 DIAGNOSIS — M546 Pain in thoracic spine: Secondary | ICD-10-CM | POA: Diagnosis not present

## 2021-04-15 DIAGNOSIS — M47814 Spondylosis without myelopathy or radiculopathy, thoracic region: Secondary | ICD-10-CM

## 2021-04-15 DIAGNOSIS — M1810 Unilateral primary osteoarthritis of first carpometacarpal joint, unspecified hand: Secondary | ICD-10-CM

## 2021-04-15 MED ORDER — DICLOFENAC SODIUM 1 % EX GEL: 2.0000 g | Freq: Four times a day (QID) | CUTANEOUS | 3 refills | Status: AC

## 2021-04-15 NOTE — Progress Notes (Addendum)
Office Visit Note   Patient: Evan Mitchell           Date of Birth: 1946/01/26           MRN: [id number] Visit Date: 04/15/2021              Requested by: Prince Solian, MD [address] [place],  [place] [address] PCP: Prince Solian, MD   Assessment & Plan: Visit Diagnoses:  1. Arthritis of carpometacarpal (CMC) joint of thumb   2. Pain in thoracic spine   3. Forestier's disease of cervicothoracic region   4. Spondylosis of thoracic region without myelopathy or radiculopathy     Plan: Extension exercises of the thoracic spine. Ice locally. If pain is occurring more frequently and  Is more constant. Voltaren gel for the thumb bilaterally up to 3-4 times per day.   Follow-Up Instructions: Return in about 3 months (around 07/16/2021).   Orders:  No orders of the defined types were placed in this encounter.  Meds ordered this encounter  Medications  . diclofenac Sodium (VOLTAREN) 1 % GEL    Sig: Apply 2 g topically 4 (four) times daily.    Dispense:  350 g    Refill:  3      Procedures: No procedures performed   Clinical Data: No additional findings.   Subjective: Chief Complaint  Patient presents with  . Middle Back - Follow-up    CT Scan Review of T-spine    75 year old male right handed with right thorax pain about the right inferior angle of the scapula. He is better, diclofenac seems to relieve the pain well and he has been to PT. CT scan was done in follow up to previous study demonstrating foci of osteosclerosis with in the left T11 pedicle and right T8 Lamina. A new CT scan was done and the two areas appear similar size to That seen nearly 2 years ago. He also underwent lab testing and ANA was positive with cytoplasmic appearance, titer is mild at 1:80. He relates a slight worsening of the pain when off the diclofenac and he restarted it and this is helped. No weight loss.   Review of Systems  Constitutional: Negative.   HENT: Negative.     Eyes: Negative.   Respiratory: Negative.    Cardiovascular: Negative.   Gastrointestinal: Negative.   Endocrine: Negative.   Genitourinary: Negative.   Musculoskeletal: Negative.   Skin: Negative.   Allergic/Immunologic: Negative.   Neurological: Negative.   Hematological: Negative.   Psychiatric/Behavioral: Negative.      Objective: Vital Signs: BP (!) 183/89 (BP Location: Left Arm, Patient Position: Sitting)   Pulse 80   Ht 6' (1.829 m)   Wt 212 lb (96.2 kg)   BMI 28.75 kg/m   Physical Exam Constitutional:      Appearance: He is well-developed.  HENT:     Head: Normocephalic and atraumatic.  Eyes:     Pupils: Pupils are equal, round, and reactive to light.  Pulmonary:     Effort: Pulmonary effort is normal.     Breath sounds: Normal breath sounds.  Abdominal:     General: Bowel sounds are normal.     Palpations: Abdomen is soft.  Musculoskeletal:        General: Normal range of motion.     Cervical back: Normal range of motion and neck supple.     Lumbar back: Negative right straight leg raise test and negative left straight leg raise test.  Skin:    General: Skin is warm and dry.  Neurological:     Mental Status: He is alert and oriented to person, place, and time.  Psychiatric:        Behavior: Behavior normal.        Thought Content: Thought content normal.        Judgment: Judgment normal.   Back Exam   Tenderness  The patient is experiencing tenderness in the thoracic.  Range of Motion  Extension:  normal  Flexion:  normal  Lateral bend right:  normal  Lateral bend left:  normal  Rotation right:  normal  Rotation left:  normal   Muscle Strength  Right Quadriceps:  5/5  Left Quadriceps:  5/5  Right Hamstrings:  5/5  Left Hamstrings:  5/5   Tests  Straight leg raise right: negative Straight leg raise left: negative  Reflexes  Patellar:  0/4 Achilles:  0/4 Biceps:  0/4 Babinski's sign: normal   Other  Toe walk: normal Heel walk:  normal Sensation: normal Erythema: no back redness Scars: absent  Comments:  Pain with palpation of both thumb MCC joints positive grind test.     Specialty Comments:  No specialty comments available.  Imaging: No results found.   PMFS History: Patient Active Problem List   Diagnosis Date Noted  . Malignant neoplasm of prostate ([facility]) 11/12/2019  . LBBB (left bundle branch block) 12/12/2012  . Hypertension, uncontrolled 01/10/2012  . Pre-diabetes 01/10/2012  . Hyperlipidemia 01/10/2012   Past Medical History:  Diagnosis Date  . Allergic rhinitis   . Anxiety   . Arthritis    hand and feet  . History of syncope 10/2017  . HTN (hypertension)   . Hypertriglyceridemia    Mild  . Impaired fasting glucose   . Inguinal hernia    Left  . LBBB (left bundle branch block) 12/12/2012  . PONV (postoperative nausea and vomiting)    nausea,dizziness after anesthesia  . Pre-diabetes    control with diet and exercise  . Prostate cancer ([facility])   . Skin cancer 2018   basal and squamous cell carcinoma removed and chemotherapy cream applied.    Family History  Problem Relation Age of Onset  . Heart attack Mother   . Skin cancer Mother   . Diabetes Mother   . Hypertension Mother   . Melanoma Mother   . Cancer Maternal Grandfather        throat/lung cancer  . Melanoma Sister   . Melanoma Sister   . Prostate cancer Brother 48  . Colon cancer Neg Hx   . Breast cancer Neg Hx   . Pancreatic cancer Neg Hx     Past Surgical History:  Procedure Laterality Date  . COLONOSCOPY    . HERNIA REPAIR  1995  . INGUINAL HERNIA REPAIR Left 02/28/2019   Procedure: LEFT INGUINAL HERNIA REPAIR WITH MESH;  Surgeon: Coralie Keens, MD;  Location: [facility];  Service: General;  Laterality: Left;  . TONSILLECTOMY  1947  . TRANSURETHRAL RESECTION OF BLADDER TUMOR N/A 10/29/2019   Procedure: TRANSURETHRAL RESECTION OF BLADDER TUMOR (TURBT)  INSTILL GEMCITABINE;  Surgeon: Irine Seal, MD;  Location: [facility];  Service: Urology;  Laterality: N/A;   Social History   Occupational History  . Occupation: custodian    Comment: retired-part time building maintenance  Tobacco Use  . Smoking status: Former    Packs/day: 2.00    Years: 20.00    Pack years: 40.00  Types: Cigarettes    Quit date: 09/19/1976    Years since quitting: 44.6  . Smokeless tobacco: Never  Vaping Use  . Vaping Use: Never used  Substance and Sexual Activity  . Alcohol use: No    Alcohol/week: 0.0 standard drinks  . Drug use: No  . Sexual activity: Not Currently

## 2021-04-15 NOTE — Patient Instructions (Signed)
  Plan: Extension exercises of the thoracic spine. Ice locally. If pain is occurring more frequently and Is more constant. Voltaren gel for the thumb bilaterally up to 3-4 times per day.

## 2021-04-19 ENCOUNTER — Telehealth: Payer: Self-pay

## 2021-04-19 NOTE — Telephone Encounter (Signed)
I called and spoke with Gwyndolyn Saxon answering questions. He advised medication was denied due to contraindication question not being answered prior to submitting. He transferred me to appeals. I was on phone 28 minutes with appeals who could only tell me that medication was denied due to contraindication question not being answered. I advised I had answered the question and was then transferred to them for appeal. At end of conversation, medication is still denied. Another appeal can be entered on a different day or by mailing appeal to [place], [place] [phone].

## 2021-04-19 NOTE — Telephone Encounter (Signed)
Hassan Rowan from [facility] called regarding pts medication.  Diflucan gel - has the pt experienced any of the reactions after taking   Will the medication be used during the period in the setting of coronary artery bypass graft surgery   Call back # [phone] option 5

## 2021-04-21 NOTE — Telephone Encounter (Signed)
I have submitted on cover my meds

## 2021-04-22 ENCOUNTER — Ambulatory Visit: Payer: Medicare Other | Admitting: Specialist

## 2021-04-22 NOTE — Telephone Encounter (Signed)
I called and spoke with his wife and advised it was denied, and that she could get it over the counter now. She states the pharmacist had told them that and she got it that way

## 2021-05-07 DIAGNOSIS — C670 Malignant neoplasm of trigone of bladder: Secondary | ICD-10-CM | POA: Diagnosis not present

## 2021-05-07 DIAGNOSIS — Z5111 Encounter for antineoplastic chemotherapy: Secondary | ICD-10-CM | POA: Diagnosis not present

## 2021-05-14 DIAGNOSIS — C670 Malignant neoplasm of trigone of bladder: Secondary | ICD-10-CM | POA: Diagnosis not present

## 2021-05-14 DIAGNOSIS — Z5111 Encounter for antineoplastic chemotherapy: Secondary | ICD-10-CM | POA: Diagnosis not present

## 2021-05-21 DIAGNOSIS — Z5111 Encounter for antineoplastic chemotherapy: Secondary | ICD-10-CM | POA: Diagnosis not present

## 2021-05-21 DIAGNOSIS — C670 Malignant neoplasm of trigone of bladder: Secondary | ICD-10-CM | POA: Diagnosis not present

## 2021-06-09 DIAGNOSIS — H11041 Peripheral pterygium, stationary, right eye: Secondary | ICD-10-CM | POA: Diagnosis not present

## 2021-06-09 DIAGNOSIS — Z961 Presence of intraocular lens: Secondary | ICD-10-CM | POA: Diagnosis not present

## 2021-06-09 DIAGNOSIS — H16223 Keratoconjunctivitis sicca, not specified as Sjogren's, bilateral: Secondary | ICD-10-CM | POA: Diagnosis not present

## 2021-06-09 DIAGNOSIS — B394 Histoplasmosis capsulati, unspecified: Secondary | ICD-10-CM | POA: Diagnosis not present

## 2021-07-02 DIAGNOSIS — Z8546 Personal history of malignant neoplasm of prostate: Secondary | ICD-10-CM | POA: Diagnosis not present

## 2021-07-10 DIAGNOSIS — Z23 Encounter for immunization: Secondary | ICD-10-CM | POA: Diagnosis not present

## 2021-07-12 DIAGNOSIS — Z8551 Personal history of malignant neoplasm of bladder: Secondary | ICD-10-CM | POA: Diagnosis not present

## 2021-07-12 DIAGNOSIS — E349 Endocrine disorder, unspecified: Secondary | ICD-10-CM | POA: Diagnosis not present

## 2021-07-12 DIAGNOSIS — Z8546 Personal history of malignant neoplasm of prostate: Secondary | ICD-10-CM | POA: Diagnosis not present

## 2021-07-15 ENCOUNTER — Ambulatory Visit: Payer: Medicare Other | Admitting: Specialist

## 2021-07-15 ENCOUNTER — Other Ambulatory Visit: Payer: Self-pay

## 2021-07-15 ENCOUNTER — Encounter: Payer: Self-pay | Admitting: Specialist

## 2021-07-15 VITALS — BP 152/75 | HR 68 | Ht 72.0 in | Wt 212.0 lb

## 2021-07-15 DIAGNOSIS — M1810 Unilateral primary osteoarthritis of first carpometacarpal joint, unspecified hand: Secondary | ICD-10-CM | POA: Diagnosis not present

## 2021-07-15 DIAGNOSIS — M47814 Spondylosis without myelopathy or radiculopathy, thoracic region: Secondary | ICD-10-CM | POA: Diagnosis not present

## 2021-07-15 DIAGNOSIS — M25641 Stiffness of right hand, not elsewhere classified: Secondary | ICD-10-CM

## 2021-07-15 DIAGNOSIS — M4813 Ankylosing hyperostosis [Forestier], cervicothoracic region: Secondary | ICD-10-CM

## 2021-07-15 DIAGNOSIS — M180 Bilateral primary osteoarthritis of first carpometacarpal joints: Secondary | ICD-10-CM | POA: Diagnosis not present

## 2021-07-15 DIAGNOSIS — M546 Pain in thoracic spine: Secondary | ICD-10-CM | POA: Diagnosis not present

## 2021-07-15 NOTE — Patient Instructions (Signed)
Plan: Avoid frequent bending and stooping  No lifting greater than 10 lbs. May use ice or moist heat for pain. Weight loss is of benefit. Best medication for lumbar disc disease is arthritis medications like diclofenac, motrin, celebrex and naprosyn. Exercise is important to improve your indurance and does allow people to function better inspite of back pain. Left thumb OA, use brace as needed, voltaren gel 3-4 x a day.

## 2021-07-15 NOTE — Progress Notes (Signed)
Office Visit Note   Patient: Evan Mitchell           Date of Birth: 03/22/1946           MRN: [id number] Visit Date: 07/15/2021              Requested by: Prince Solian, MD [address] [place],  [place] [address] PCP: Prince Solian, MD   Assessment & Plan: Visit Diagnoses:  1. Pain in thoracic spine   2. Forestier's disease of cervicothoracic region   3. Arthritis of carpometacarpal (CMC) joint of thumb   4. Spondylosis of thoracic region without myelopathy or radiculopathy   5. Stiffness of right hand joint     Plan: Avoid frequent bending and stooping  No lifting greater than 10 lbs. May use ice or moist heat for pain. Weight loss is of benefit. Best medication for lumbar disc disease is arthritis medications like diclofenac, motrin, celebrex and naprosyn. Exercise is important to improve your indurance and does allow people to function better inspite of back pain. Left thumb OA, use brace as needed, voltaren gel 3-4 x a day.  Follow-Up Instructions: No follow-ups on file.   Orders:  No orders of the defined types were placed in this encounter.  No orders of the defined types were placed in this encounter.     Procedures: No procedures performed   Clinical Data: No additional findings.   Subjective: Chief Complaint  Patient presents with   Middle Back - Pain, Follow-up    HPI  Review of Systems   Objective: Vital Signs: BP (!) 152/75   Pulse 68   Ht 6' (1.829 m)   Wt 212 lb (96.2 kg)   BMI 28.75 kg/m   Physical Exam  Ortho Exam  Specialty Comments:  No specialty comments available.  Imaging: No results found.   PMFS History: Patient Active Problem List   Diagnosis Date Noted   Malignant neoplasm of prostate ([facility]) 11/12/2019   LBBB (left bundle branch block) 12/12/2012   Hypertension, uncontrolled 01/10/2012   Pre-diabetes 01/10/2012   Hyperlipidemia 01/10/2012   Past Medical History:  Diagnosis Date   Allergic  rhinitis    Anxiety    Arthritis    hand and feet   History of syncope 10/2017   HTN (hypertension)    Hypertriglyceridemia    Mild   Impaired fasting glucose    Inguinal hernia    Left   LBBB (left bundle branch block) 12/12/2012   PONV (postoperative nausea and vomiting)    nausea,dizziness after anesthesia   Pre-diabetes    control with diet and exercise   Prostate cancer ([facility])    Skin cancer 2018   basal and squamous cell carcinoma removed and chemotherapy cream applied.    Family History  Problem Relation Age of Onset   Heart attack Mother    Skin cancer Mother    Diabetes Mother    Hypertension Mother    Melanoma Mother    Cancer Maternal Grandfather        throat/lung cancer   Melanoma Sister    Melanoma Sister    Prostate cancer Brother 32   Colon cancer Neg Hx    Breast cancer Neg Hx    Pancreatic cancer Neg Hx     Past Surgical History:  Procedure Laterality Date   COLONOSCOPY     HERNIA REPAIR  1995   INGUINAL HERNIA REPAIR Left 02/28/2019   Procedure: LEFT INGUINAL HERNIA REPAIR WITH MESH;  Surgeon: Coralie Keens, MD;  Location: [facility];  Service: General;  Laterality: Left;   TONSILLECTOMY  1947   TRANSURETHRAL RESECTION OF BLADDER TUMOR N/A 10/29/2019   Procedure: TRANSURETHRAL RESECTION OF BLADDER TUMOR (TURBT)  INSTILL GEMCITABINE;  Surgeon: Irine Seal, MD;  Location: [facility];  Service: Urology;  Laterality: N/A;   Social History   Occupational History   Occupation: custodian    Comment: retired-part time building maintenance  Tobacco Use   Smoking status: Former    Packs/day: 2.00    Years: 20.00    Pack years: 40.00    Types: Cigarettes    Quit date: 09/19/1976    Years since quitting: 44.8   Smokeless tobacco: Never  Vaping Use   Vaping Use: Never used  Substance and Sexual Activity   Alcohol use: No    Alcohol/week: 0.0 standard drinks   Drug use: No   Sexual activity: Not Currently

## 2021-07-20 ENCOUNTER — Ambulatory Visit: Payer: Medicare Other | Attending: Internal Medicine

## 2021-07-20 ENCOUNTER — Other Ambulatory Visit (HOSPITAL_BASED_OUTPATIENT_CLINIC_OR_DEPARTMENT_OTHER): Payer: Self-pay

## 2021-07-20 DIAGNOSIS — Z23 Encounter for immunization: Secondary | ICD-10-CM

## 2021-07-20 MED ORDER — PFIZER COVID-19 VAC BIVALENT 30 MCG/0.3ML IM SUSP
INTRAMUSCULAR | 0 refills | Status: DC
  Filled 2021-07-20: qty 0.3, 1d supply, fill #0

## 2021-07-20 NOTE — Progress Notes (Signed)
   Covid-19 Vaccination Clinic  Name:  Evan Mitchell    MRN: [id number] DOB: 11/29/1945  07/20/2021  [name] was observed post Covid-19 immunization for 15 minutes without incident. He was provided with Vaccine Information Sheet and instruction to access the V-Safe system.   [name] was instructed to call 911 with any severe reactions post vaccine: Difficulty breathing  Swelling of face and throat  A fast heartbeat  A bad rash all over body  Dizziness and weakness   Immunizations Administered     Name Date Dose VIS Date Route   Pfizer Covid-19 Vaccine Bivalent Booster 07/20/2021  2:13 PM 0.3 mL 05/19/2021 Intramuscular   Manufacturer: [place]   Lot: [id number]   [place]: [phone]

## 2021-07-28 DIAGNOSIS — L814 Other melanin hyperpigmentation: Secondary | ICD-10-CM | POA: Diagnosis not present

## 2021-07-28 DIAGNOSIS — R7301 Impaired fasting glucose: Secondary | ICD-10-CM | POA: Diagnosis not present

## 2021-07-28 DIAGNOSIS — L570 Actinic keratosis: Secondary | ICD-10-CM | POA: Diagnosis not present

## 2021-07-28 DIAGNOSIS — M109 Gout, unspecified: Secondary | ICD-10-CM | POA: Diagnosis not present

## 2021-07-28 DIAGNOSIS — D1801 Hemangioma of skin and subcutaneous tissue: Secondary | ICD-10-CM | POA: Diagnosis not present

## 2021-07-28 DIAGNOSIS — C4442 Squamous cell carcinoma of skin of scalp and neck: Secondary | ICD-10-CM | POA: Diagnosis not present

## 2021-07-28 DIAGNOSIS — Z85828 Personal history of other malignant neoplasm of skin: Secondary | ICD-10-CM | POA: Diagnosis not present

## 2021-07-28 DIAGNOSIS — I10 Essential (primary) hypertension: Secondary | ICD-10-CM | POA: Diagnosis not present

## 2021-07-28 DIAGNOSIS — E781 Pure hyperglyceridemia: Secondary | ICD-10-CM | POA: Diagnosis not present

## 2021-08-04 DIAGNOSIS — R7301 Impaired fasting glucose: Secondary | ICD-10-CM | POA: Diagnosis not present

## 2021-08-04 DIAGNOSIS — Z0000 Encounter for general adult medical examination without abnormal findings: Secondary | ICD-10-CM | POA: Diagnosis not present

## 2021-08-04 DIAGNOSIS — I10 Essential (primary) hypertension: Secondary | ICD-10-CM | POA: Diagnosis not present

## 2021-08-04 DIAGNOSIS — R82998 Other abnormal findings in urine: Secondary | ICD-10-CM | POA: Diagnosis not present

## 2021-08-04 DIAGNOSIS — E781 Pure hyperglyceridemia: Secondary | ICD-10-CM | POA: Diagnosis not present

## 2021-09-21 ENCOUNTER — Ambulatory Visit: Payer: Medicare Other | Admitting: Cardiology

## 2021-10-06 DIAGNOSIS — Z8546 Personal history of malignant neoplasm of prostate: Secondary | ICD-10-CM | POA: Diagnosis not present

## 2021-10-13 DIAGNOSIS — Z8546 Personal history of malignant neoplasm of prostate: Secondary | ICD-10-CM | POA: Diagnosis not present

## 2021-10-13 DIAGNOSIS — Z8551 Personal history of malignant neoplasm of bladder: Secondary | ICD-10-CM | POA: Diagnosis not present

## 2021-10-21 IMAGING — CT CT T SPINE W/O CM
3 of 5 series · 10 of 33 positions shown, 11 images · non-contrast
Comparison: Bone scan 10/10/2019.  CT chest 10/23/2019.

CLINICAL DATA: Mid back pain. History of prostate and bladder
cancer post treatment.

EXAM:
CT THORACIC SPINE WITHOUT CONTRAST
TECHNIQUE: Multidetector CT images of the thoracic were obtained using the
standard protocol without intravenous contrast.

[Series 5: t-spine 2.00 br60 s3 · axial · 0.36mm/px · z∈[-1260,-1068]mm · 2 of 194 slices shown, 3 images (1 of 3)]
[im 49/194  soft-tissue]
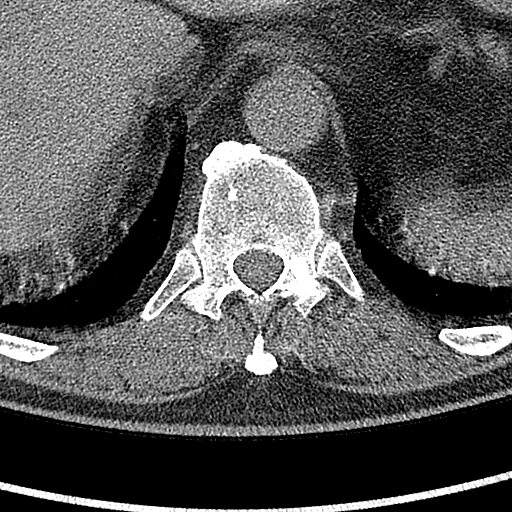
[im 49/194  bone]
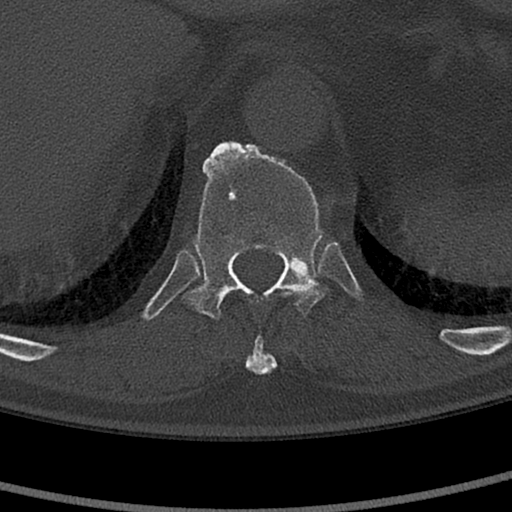
[im 145/194  bone]
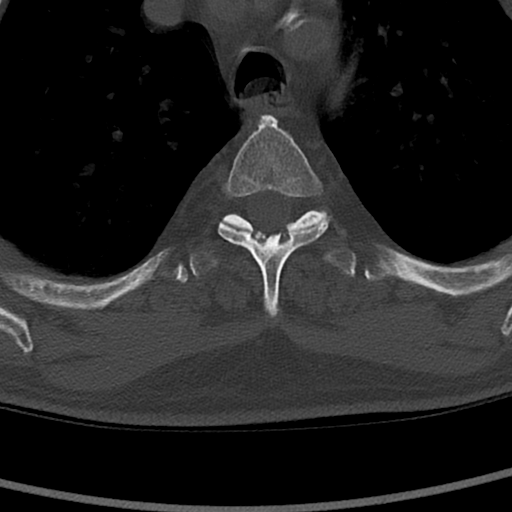

[Series 7: t-spine 2.00 br60 s3 · sagittal · 0.36mm/px · 5 of 93 slices shown (2 of 3)]
[im 31/93  bone]
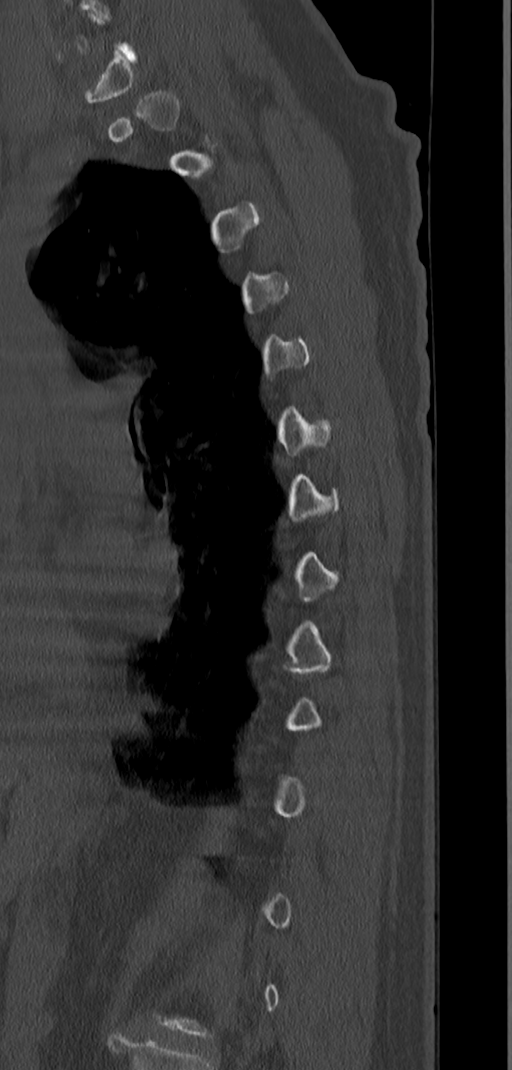
[im 39/93  bone]
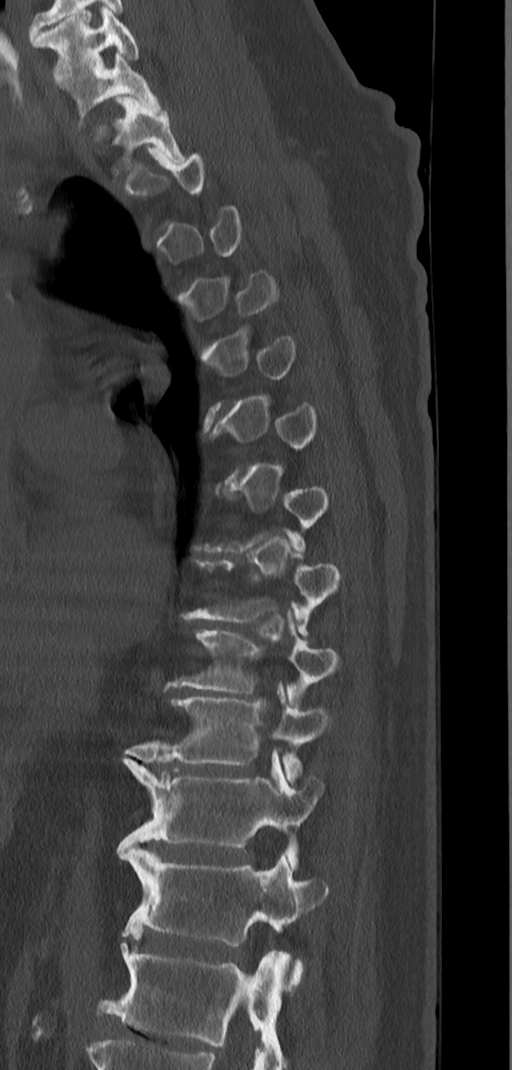
[im 47/93  bone]
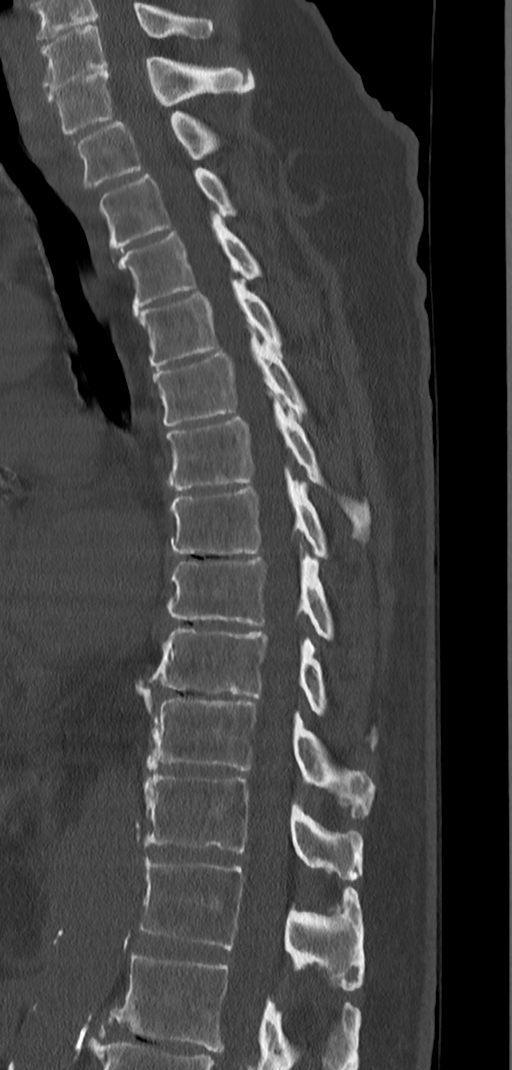
[im 54/93  bone]
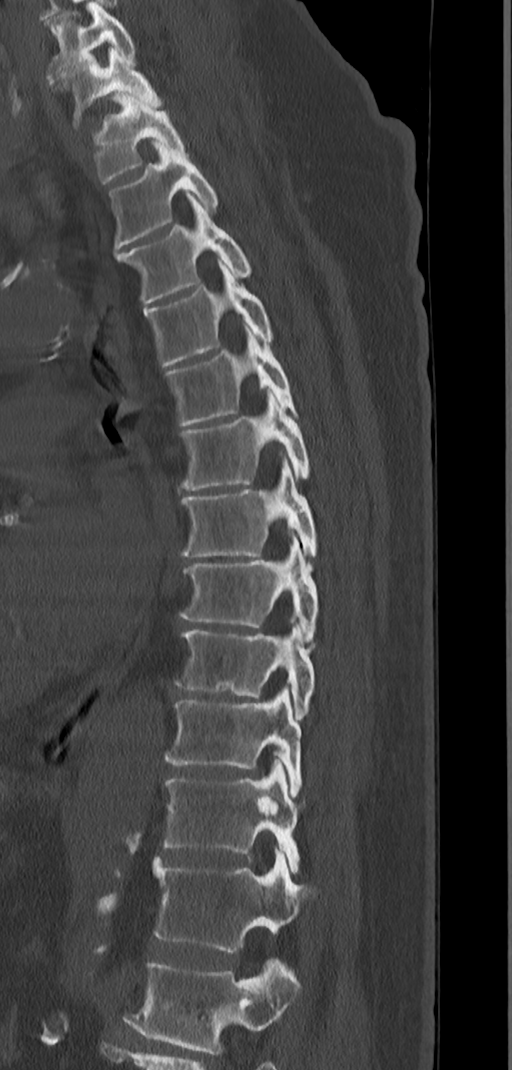
[im 62/93  bone]
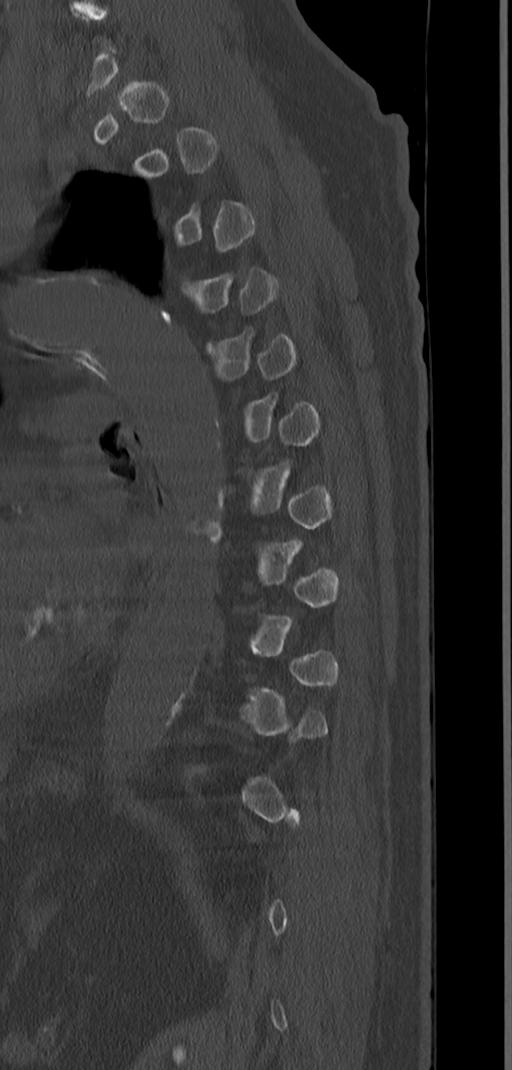

[Series 9: t-spine 2.00 br60 s3 · coronal · 0.36mm/px · 3 of 93 slices shown (3 of 3)]
[im 19/93  bone]
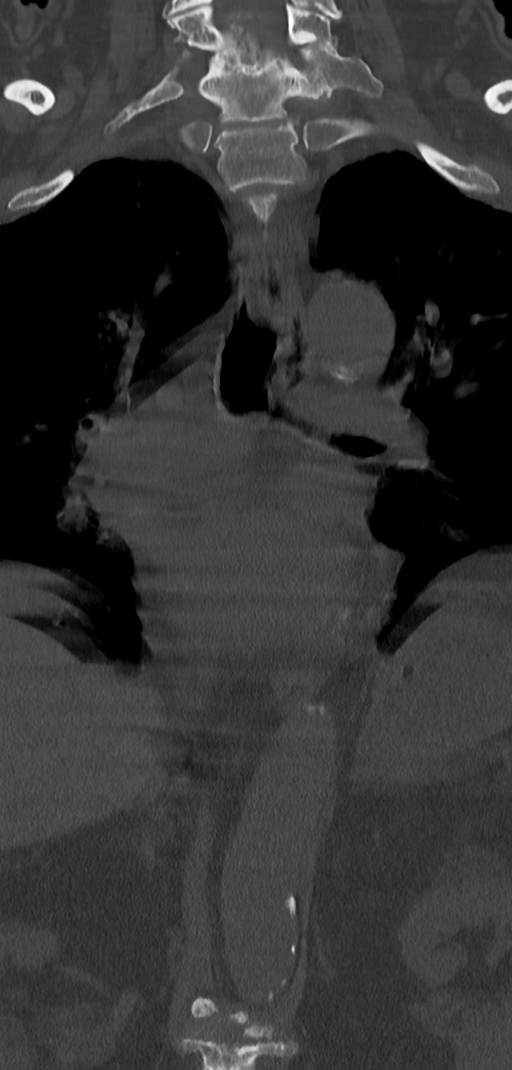
[im 37/93  bone]
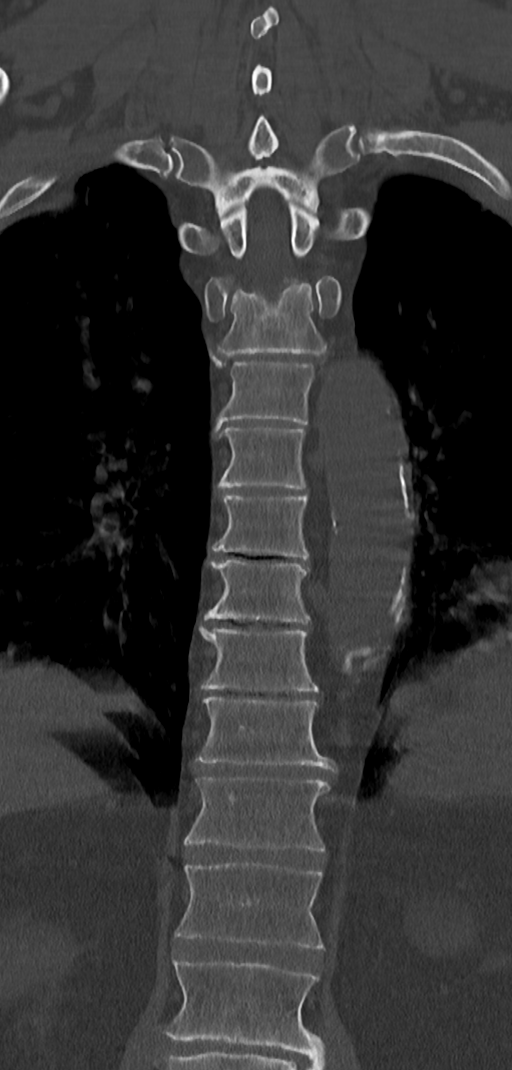
[im 56/93  bone]
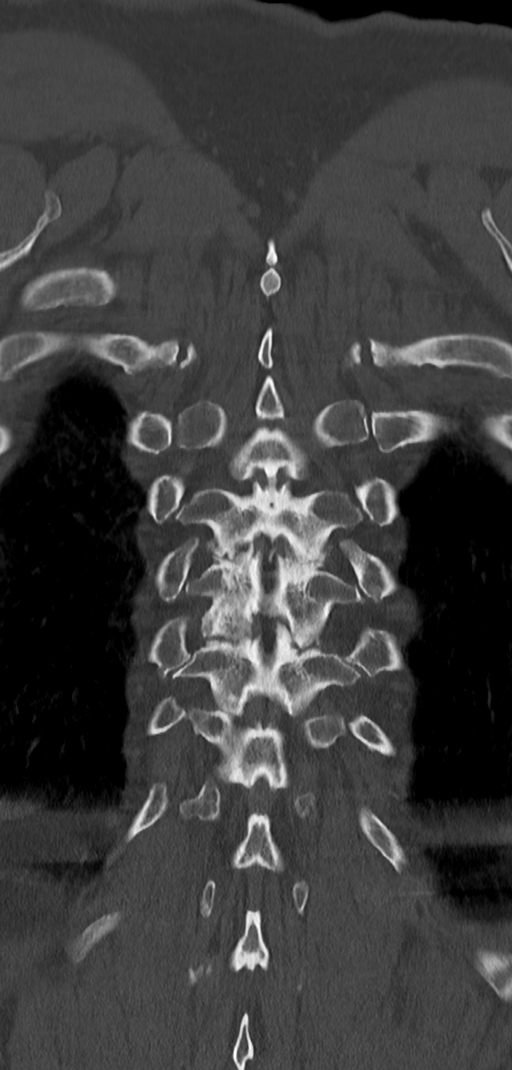

[10 of 33 positions shown; findings below may reference images not displayed]

FINDINGS: Alignment: Normal.

Vertebrae: No vertebral compression deformities. Multiple
circumscribed sclerotic foci are demonstrated. Largest is in the
left pedicle at T11, measuring 7 mm in diameter. Additional
sclerotic foci are demonstrated in the left pedicle at L1, the T11
vertebral body, the right pedicle of T11, the right transverse
process at T8. Similar appearance to previous study. These may
represent metastatic foci or multiple bone islands.

Paraspinal and other soft tissues: No abnormal paraspinal soft
tissue mass or infiltration. Incidental note of aortic vascular
calcifications. Cyst in the upper pole of the left kidney.

Disc levels: Degenerative changes throughout with narrowed
interspaces and endplate osteophyte formation.
IMPRESSION: Several sclerotic bone foci in the thoracic spine, similar to prior
study. Possibly metastatic or bone islands. No developing
destructive bone lesions. Diffuse degenerative changes.

## 2021-11-18 ENCOUNTER — Telehealth: Payer: Self-pay | Admitting: Cardiology

## 2021-11-18 ENCOUNTER — Other Ambulatory Visit: Payer: Self-pay

## 2021-11-18 MED ORDER — CARVEDILOL 25 MG PO TABS: 25.0000 mg | ORAL_TABLET | Freq: Two times a day (BID) | ORAL | 0 refills | Status: DC

## 2021-11-18 MED ORDER — CARVEDILOL 25 MG PO TABS: 25.0000 mg | ORAL_TABLET | Freq: Two times a day (BID) | ORAL | 3 refills | Status: DC

## 2021-11-18 NOTE — Telephone Encounter (Signed)
?*  STAT* If patient is at the pharmacy, call can be transferred to refill team. ? ? ?1. Which medications need to be refilled? (please list name of each medication and dose if known)  ?carvedilol (COREG) 25 MG tablet ? ?2. Which pharmacy/location (including street and city if local pharmacy) is medication to be sent to? ?[facility] [place], [place] ? ?3. Do they need a 30 day or 90 day supply? 90 with refills ?

## 2021-12-14 NOTE — Progress Notes (Signed)
? ?Evan Mitchell ?Date of Birth: [birth year] ?Medical Record #[id number] ? ?History of Present Illness: ?Evan Mitchell is seen today for followup of his hypertension and hyperlipidemia. He has a chronic LBBB.  ? ?He has a history of  Prostate and bladder CA. Treated with BCG irrigations and RT/hormonal therapy. ? ? It's been a tough couple of years but he is finally feeling better.  He is active and spirits are better.  BP readings have been well controlled. Followed by Dr Jeffie Pollock for his cancer. ? ? ?Current Outpatient Medications on File Prior to Visit  ?Medication Sig Dispense Refill  ? acetaminophen (TYLENOL) 650 MG CR tablet Take 1,300 mg by mouth at bedtime as needed for pain.    ? allopurinol (ZYLOPRIM) 300 MG tablet Take 300 mg by mouth daily.     ? Calcium Carbonate-Vitamin D (CALCIUM + D PO) Take 2 tablets by mouth daily with breakfast.     ? carvedilol (COREG) 25 MG tablet Take 1 tablet (25 mg total) by mouth 2 (two) times daily with a meal. Keep upcoming appointment for future refills. 90 tablet 3  ? diclofenac (VOLTAREN) 50 MG EC tablet Take 1 tablet (50 mg total) by mouth 2 (two) times daily. (Patient taking differently: Take 50 mg by mouth as needed.) 60 tablet 2  ? diclofenac Sodium (VOLTAREN) 1 % GEL Apply 2 g topically 4 (four) times daily. 350 g 3  ? escitalopram (LEXAPRO) 5 MG tablet Take 5 mg by mouth daily.    ? GLUCOSAMINE-CHONDROITIN PO Take 1 tablet by mouth 3 (three) times daily with meals.     ? hydrochlorothiazide (HYDRODIURIL) 25 MG tablet TAKE 1 TABLET (25 MG TOTAL) BY MOUTH DAILY. (Patient taking differently: Take 25 mg by mouth daily.) 90 tablet 2  ? lisinopril (PRINIVIL,ZESTRIL) 40 MG tablet Take 40 mg by mouth daily.    ? Melatonin 3 MG TABS Take 6 mg by mouth at bedtime.    ? Multiple Vitamins-Minerals (PRESERVISION AREDS PO) Take 1 tablet by mouth 2 (two) times daily. Breakfast & supper    ? NON FORMULARY Elderberry gummies    ? Omega-3 Fatty Acids (FISH OIL PO) Take 1 tablet by mouth 3  (three) times daily with meals.     ? simvastatin (ZOCOR) 40 MG tablet Take 40 mg by mouth daily with supper.     ? vitamin C (ASCORBIC ACID) 500 MG tablet Take 500 mg by mouth daily with breakfast.     ? COVID-19 mRNA bivalent vaccine, Pfizer, (PFIZER COVID-19 VAC BIVALENT) injection Inject into the muscle. 0.3 mL 0  ? COVID-19 mRNA Vac-TriS, Pfizer, (PFIZER-BIONT COVID-19 VAC-TRIS) SUSP injection Inject into the muscle. 0.3 mL 0  ? ?Current Facility-Administered Medications on File Prior to Visit  ?Medication Dose Route Frequency Provider Last Rate Last Admin  ? gemcitabine (GEMZAR) chemo syringe for bladder instillation 2,000 mg  2,000 mg Bladder Instillation Once Irine Seal, MD      ? ? ?Allergies  ?Allergen Reactions  ? Aleve [Naproxen Sodium] Swelling  ?  Fluid retention  ? Codeine   ?  nausea  ? Sildenafil   ?  Other reaction(s): Headaches, flushing  ? Tramadol   ?  Intolerance. Causes lightheadedness, dizziness, and unsteady gait.  ? ? ?Past Medical History:  ?Diagnosis Date  ? Allergic rhinitis   ? Anxiety   ? Arthritis   ? hand and feet  ? History of syncope 10/2017  ? HTN (hypertension)   ? Hypertriglyceridemia   ?  Mild  ? Impaired fasting glucose   ? Inguinal hernia   ? Left  ? LBBB (left bundle branch block) 12/12/2012  ? PONV (postoperative nausea and vomiting)   ? nausea,dizziness after anesthesia  ? Pre-diabetes   ? control with diet and exercise  ? Prostate cancer ([facility])   ? Skin cancer 2018  ? basal and squamous cell carcinoma removed and chemotherapy cream applied.  ? ? ?Past Surgical History:  ?Procedure Laterality Date  ? COLONOSCOPY    ? HERNIA REPAIR  1995  ? INGUINAL HERNIA REPAIR Left 02/28/2019  ? Procedure: LEFT INGUINAL HERNIA REPAIR WITH MESH;  Surgeon: Coralie Keens, MD;  Location: [facility];  Service: General;  Laterality: Left;  ? TONSILLECTOMY  1947  ? TRANSURETHRAL RESECTION OF BLADDER TUMOR N/A 10/29/2019  ? Procedure: TRANSURETHRAL RESECTION OF BLADDER TUMOR  (TURBT)  INSTILL GEMCITABINE;  Surgeon: Irine Seal, MD;  Location: [facility];  Service: Urology;  Laterality: N/A;  ? ? ?Social History  ? ?Tobacco Use  ?Smoking Status Former  ? Packs/day: 2.00  ? Years: 20.00  ? Pack years: 40.00  ? Types: Cigarettes  ? Quit date: 09/19/1976  ? Years since quitting: 45.2  ?Smokeless Tobacco Never  ? ? ?Social History  ? ?Substance and Sexual Activity  ?Alcohol Use No  ? Alcohol/week: 0.0 standard drinks  ? ? ?Family History  ?Problem Relation Age of Onset  ? Heart attack Mother   ? Skin cancer Mother   ? Diabetes Mother   ? Hypertension Mother   ? Melanoma Mother   ? Cancer Maternal Grandfather   ?     throat/lung cancer  ? Melanoma Sister   ? Melanoma Sister   ? Prostate cancer Brother 47  ? Colon cancer Neg Hx   ? Breast cancer Neg Hx   ? Pancreatic cancer Neg Hx   ? ? ?Review of Systems: ?As noted in history of present illness.  All other systems were reviewed and are negative. ? ?Physical Exam: ?BP (!) 146/76   Pulse 71   Ht 6' (1.829 m)   Wt 219 lb 6.4 oz (99.5 kg)   SpO2 96%   BMI 29.76 kg/m?  ?GENERAL:  Well appearing ?HEENT:  PERRL, EOMI, sclera are clear. Oropharynx is clear. ?NECK:  No jugular venous distention, carotid upstroke brisk and symmetric, no bruits, no thyromegaly or adenopathy ?LUNGS:  Clear to auscultation bilaterally ?CHEST:  Unremarkable ?HEART:  RRR,  PMI not displaced or sustained,S1 and S2 within normal limits, no S3, no S4: no clicks, no rubs, no murmurs ?ABD:  Soft, nontender. BS +, no masses or bruits. No hepatomegaly, no splenomegaly ?EXT:  2 + pulses throughout, no edema, no cyanosis no clubbing ?SKIN:  Warm and dry.  No rashes ?NEURO:  Alert and oriented x 3. Cranial nerves II through XII intact. ?PSYCH:  Cognitively intact ? ? ?LABORATORY DATA: ? ?Lab Results  ?Component Value Date  ? WBC 5.6 10/28/2019  ? HGB 16.4 10/28/2019  ? HCT 51.1 10/28/2019  ? PLT 130 (L) 10/28/2019  ? GLUCOSE 120 (H) 10/28/2019  ? ALT 46 (H) 11/30/2018  ? AST 38  11/30/2018  ? NA 141 10/28/2019  ? K 3.8 10/28/2019  ? CL 105 10/28/2019  ? CREATININE 0.86 10/28/2019  ? BUN 16 10/28/2019  ? CO2 28 10/28/2019  ? HGBA1C 6.1 (H) 10/28/2019  ? ?Labs reviewed from primary care: 02/09/17: cholesterol 121, triglycerides 106, HDL 42, LDL 58. Glucose 113. Other chemistries, CBC,  TSH normal. PSA 7.44. A1c 5.7% ?Dated 03/29/19: cholesterol 131, triglycerides 90, HDL 43, LDL 70. A1c 5.7%. Normal CMET, CBC, TSH ?Dated 07/28/21: cholesterol 125, triglycerides 101, HDL 46, LDL 59. A1c 5.8%. CMET normal. ? ?Ecg today shows NSR with rate 71, nonspecific ST abnormality. LBBB resolved.  I have personally reviewed and interpreted this study. ? ?Assessment / Plan: ?1. Hypertension. Blood pressure is  well controlled on combination of carvedilol, lisinopril, and HCTZ.  ? ?2. Left bundle branch block- intermittent. No ischemia noted on prior stress testing. Ejection fraction was normal by prior echo. LBBB is not present today ? ?3. Hypercholesterolemia. On statin with excellent results. ? ? ?Follow up in one year ? ? ? ? ?

## 2021-12-20 ENCOUNTER — Ambulatory Visit (INDEPENDENT_AMBULATORY_CARE_PROVIDER_SITE_OTHER): Payer: Medicare Other | Admitting: Cardiology

## 2021-12-20 ENCOUNTER — Encounter: Payer: Self-pay | Admitting: Cardiology

## 2021-12-20 VITALS — BP 146/76 | HR 71 | Ht 72.0 in | Wt 219.4 lb

## 2021-12-20 DIAGNOSIS — I10 Essential (primary) hypertension: Secondary | ICD-10-CM

## 2021-12-20 DIAGNOSIS — I447 Left bundle-branch block, unspecified: Secondary | ICD-10-CM

## 2021-12-20 DIAGNOSIS — E7800 Pure hypercholesterolemia, unspecified: Secondary | ICD-10-CM

## 2021-12-20 MED ORDER — CARVEDILOL 25 MG PO TABS: 25.0000 mg | ORAL_TABLET | Freq: Two times a day (BID) | ORAL | 3 refills | Status: DC

## 2021-12-27 DIAGNOSIS — Z8551 Personal history of malignant neoplasm of bladder: Secondary | ICD-10-CM | POA: Diagnosis not present

## 2022-01-05 DIAGNOSIS — C679 Malignant neoplasm of bladder, unspecified: Secondary | ICD-10-CM | POA: Diagnosis not present

## 2022-01-05 DIAGNOSIS — K7689 Other specified diseases of liver: Secondary | ICD-10-CM | POA: Diagnosis not present

## 2022-01-05 DIAGNOSIS — C670 Malignant neoplasm of trigone of bladder: Secondary | ICD-10-CM | POA: Diagnosis not present

## 2022-01-05 DIAGNOSIS — N281 Cyst of kidney, acquired: Secondary | ICD-10-CM | POA: Diagnosis not present

## 2022-01-05 DIAGNOSIS — K838 Other specified diseases of biliary tract: Secondary | ICD-10-CM | POA: Diagnosis not present

## 2022-01-12 DIAGNOSIS — Z8551 Personal history of malignant neoplasm of bladder: Secondary | ICD-10-CM | POA: Diagnosis not present

## 2022-01-12 DIAGNOSIS — Z8546 Personal history of malignant neoplasm of prostate: Secondary | ICD-10-CM | POA: Diagnosis not present

## 2022-01-13 ENCOUNTER — Encounter: Payer: Self-pay | Admitting: Specialist

## 2022-01-13 ENCOUNTER — Ambulatory Visit: Payer: Medicare Other | Admitting: Specialist

## 2022-01-13 VITALS — BP 153/80 | HR 78 | Ht 72.0 in | Wt 220.0 lb

## 2022-01-13 DIAGNOSIS — M4813 Ankylosing hyperostosis [Forestier], cervicothoracic region: Secondary | ICD-10-CM | POA: Diagnosis not present

## 2022-01-13 DIAGNOSIS — M1810 Unilateral primary osteoarthritis of first carpometacarpal joint, unspecified hand: Secondary | ICD-10-CM

## 2022-01-13 DIAGNOSIS — M25641 Stiffness of right hand, not elsewhere classified: Secondary | ICD-10-CM | POA: Diagnosis not present

## 2022-01-13 DIAGNOSIS — M47814 Spondylosis without myelopathy or radiculopathy, thoracic region: Secondary | ICD-10-CM | POA: Diagnosis not present

## 2022-01-13 DIAGNOSIS — M546 Pain in thoracic spine: Secondary | ICD-10-CM

## 2022-01-13 NOTE — Progress Notes (Signed)
? ?Office Visit Note ?  ?Patient: Evan Mitchell           ?Date of Birth: 1946-07-24           ?MRN: [id number] ?Visit Date: 01/13/2022 ?             ?Requested by: Prince Solian, MD ?[address] ?[place],  [place] [address] ?PCP: Prince Solian, MD ? ? ?Assessment & Plan: ?Visit Diagnoses:  ?1. Forestier's disease of cervicothoracic region   ?2. Arthritis of carpometacarpal (CMC) joint of thumb   ?3. Spondylosis of thoracic region without myelopathy or radiculopathy   ?4. Stiffness of right hand joint   ?5. Pain in thoracic spine   ? ? ?Plan: Avoid frequent bending and stooping  ?No lifting greater than 10 lbs. ?May use ice or moist heat for pain. ?Weight loss is of benefit. ?Best medication for lumbar disc disease is arthritis medications like motrin, celebrex and naprosyn. ?Exercise is important to improve your indurance and does allow people to function better inspite of back pain. ?  ? ?Follow-Up Instructions: No follow-ups on file.  ? ?Orders:  ?No orders of the defined types were placed in this encounter. ? ?No orders of the defined types were placed in this encounter. ? ? ? ? Procedures: ?No procedures performed ? ? ?Clinical Data: ?No additional findings. ? ? ?Subjective: ?Chief Complaint  ?Patient presents with  ? Lower Back - Follow-up  ? ? ?76 year old male with history of DISH and mid back pain. He stop golf some time ago and he is using resistance exercises and he is not hurting today. No bowel or bladder difficulty.  ? ? ?Review of Systems  ?Constitutional: Negative.   ?HENT: Negative.    ?Eyes: Negative.   ?Respiratory: Negative.    ?Cardiovascular: Negative.   ?Gastrointestinal: Negative.   ?Endocrine: Negative.   ?Genitourinary: Negative.   ?Musculoskeletal: Negative.   ?Skin: Negative.   ?Allergic/Immunologic: Negative.   ?Neurological: Negative.   ?Hematological: Negative.   ?Psychiatric/Behavioral: Negative.    ? ? ?Objective: ?Vital Signs: BP (!) 153/80 (BP Location: Left Arm, Patient  Position: Sitting)   Pulse 78   Ht 6' (1.829 m)   Wt 220 lb (99.8 kg)   BMI 29.84 kg/m?  ? ?Physical Exam ?Musculoskeletal:  ?   Lumbar back: Negative right straight leg raise test and negative left straight leg raise test.  ? ? ?Back Exam  ? ?Tenderness  ?The patient is experiencing tenderness in the lumbar and thoracic. ? ?Range of Motion  ?Extension:  abnormal  ?Flexion:  abnormal  ?Lateral bend right:  abnormal  ?Lateral bend left:  abnormal  ?Rotation right:  abnormal  ?Rotation left:  abnormal  ? ?Muscle Strength  ?Right Quadriceps:  5/5  ?Left Quadriceps:  5/5  ?Right Hamstrings:  5/5  ?Left Hamstrings:  5/5  ? ?Tests  ?Straight leg raise right: negative ?Straight leg raise left: negative ? ?Reflexes  ?Patellar:  2/4 ?Achilles:  2/4 ? ?Other  ?Toe walk: normal ?Heel walk: normal ?Gait: normal  ?Erythema: no back redness ?Scars: absent ? ? ? ? ?Specialty Comments:  ?No specialty comments available. ? ?Imaging: ?No results found. ? ? ?PMFS History: ?Patient Active Problem List  ? Diagnosis Date Noted  ? Malignant neoplasm of prostate ([facility]) 11/12/2019  ? LBBB (left bundle branch block) 12/12/2012  ? Hypertension, uncontrolled 01/10/2012  ? Pre-diabetes 01/10/2012  ? Hyperlipidemia 01/10/2012  ? ?Past Medical History:  ?Diagnosis Date  ? Allergic rhinitis   ?  Anxiety   ? Arthritis   ? hand and feet  ? History of syncope 10/2017  ? HTN (hypertension)   ? Hypertriglyceridemia   ? Mild  ? Impaired fasting glucose   ? Inguinal hernia   ? Left  ? LBBB (left bundle branch block) 12/12/2012  ? PONV (postoperative nausea and vomiting)   ? nausea,dizziness after anesthesia  ? Pre-diabetes   ? control with diet and exercise  ? Prostate cancer ([facility])   ? Skin cancer 2018  ? basal and squamous cell carcinoma removed and chemotherapy cream applied.  ?  ?Family History  ?Problem Relation Age of Onset  ? Heart attack Mother   ? Skin cancer Mother   ? Diabetes Mother   ? Hypertension Mother   ? Melanoma Mother   ? Cancer  Maternal Grandfather   ?     throat/lung cancer  ? Melanoma Sister   ? Melanoma Sister   ? Prostate cancer Brother 76  ? Colon cancer Neg Hx   ? Breast cancer Neg Hx   ? Pancreatic cancer Neg Hx   ?  ?Past Surgical History:  ?Procedure Laterality Date  ? COLONOSCOPY    ? HERNIA REPAIR  1995  ? INGUINAL HERNIA REPAIR Left 02/28/2019  ? Procedure: LEFT INGUINAL HERNIA REPAIR WITH MESH;  Surgeon: Coralie Keens, MD;  Location: [facility];  Service: General;  Laterality: Left;  ? TONSILLECTOMY  1947  ? TRANSURETHRAL RESECTION OF BLADDER TUMOR N/A 10/29/2019  ? Procedure: TRANSURETHRAL RESECTION OF BLADDER TUMOR (TURBT)  INSTILL GEMCITABINE;  Surgeon: Irine Seal, MD;  Location: [facility];  Service: Urology;  Laterality: N/A;  ? ?Social History  ? ?Occupational History  ? Occupation: custodian  ?  Comment: retired-part time building maintenance  ?Tobacco Use  ? Smoking status: Former  ?  Packs/day: 2.00  ?  Years: 20.00  ?  Pack years: 40.00  ?  Types: Cigarettes  ?  Quit date: 09/19/1976  ?  Years since quitting: 45.3  ? Smokeless tobacco: Never  ?Vaping Use  ? Vaping Use: Never used  ?Substance and Sexual Activity  ? Alcohol use: No  ?  Alcohol/week: 0.0 standard drinks  ? Drug use: No  ? Sexual activity: Not Currently  ? ? ? ? ? ? ?

## 2022-01-21 DIAGNOSIS — C670 Malignant neoplasm of trigone of bladder: Secondary | ICD-10-CM | POA: Diagnosis not present

## 2022-01-21 DIAGNOSIS — Z5111 Encounter for antineoplastic chemotherapy: Secondary | ICD-10-CM | POA: Diagnosis not present

## 2022-01-28 DIAGNOSIS — C670 Malignant neoplasm of trigone of bladder: Secondary | ICD-10-CM | POA: Diagnosis not present

## 2022-01-28 DIAGNOSIS — Z5111 Encounter for antineoplastic chemotherapy: Secondary | ICD-10-CM | POA: Diagnosis not present

## 2022-02-02 DIAGNOSIS — I10 Essential (primary) hypertension: Secondary | ICD-10-CM | POA: Diagnosis not present

## 2022-02-02 DIAGNOSIS — C61 Malignant neoplasm of prostate: Secondary | ICD-10-CM | POA: Diagnosis not present

## 2022-02-02 DIAGNOSIS — E781 Pure hyperglyceridemia: Secondary | ICD-10-CM | POA: Diagnosis not present

## 2022-02-02 DIAGNOSIS — R7301 Impaired fasting glucose: Secondary | ICD-10-CM | POA: Diagnosis not present

## 2022-02-04 DIAGNOSIS — Z5111 Encounter for antineoplastic chemotherapy: Secondary | ICD-10-CM | POA: Diagnosis not present

## 2022-02-04 DIAGNOSIS — C670 Malignant neoplasm of trigone of bladder: Secondary | ICD-10-CM | POA: Diagnosis not present

## 2022-06-15 DIAGNOSIS — H11041 Peripheral pterygium, stationary, right eye: Secondary | ICD-10-CM | POA: Diagnosis not present

## 2022-06-15 DIAGNOSIS — Z961 Presence of intraocular lens: Secondary | ICD-10-CM | POA: Diagnosis not present

## 2022-06-15 DIAGNOSIS — B394 Histoplasmosis capsulati, unspecified: Secondary | ICD-10-CM | POA: Diagnosis not present

## 2022-06-25 DIAGNOSIS — Z23 Encounter for immunization: Secondary | ICD-10-CM | POA: Diagnosis not present

## 2022-07-08 DIAGNOSIS — Z8546 Personal history of malignant neoplasm of prostate: Secondary | ICD-10-CM | POA: Diagnosis not present

## 2022-07-15 DIAGNOSIS — Z8551 Personal history of malignant neoplasm of bladder: Secondary | ICD-10-CM | POA: Diagnosis not present

## 2022-07-22 DIAGNOSIS — C670 Malignant neoplasm of trigone of bladder: Secondary | ICD-10-CM | POA: Diagnosis not present

## 2022-07-22 DIAGNOSIS — Z5111 Encounter for antineoplastic chemotherapy: Secondary | ICD-10-CM | POA: Diagnosis not present

## 2022-07-29 DIAGNOSIS — C670 Malignant neoplasm of trigone of bladder: Secondary | ICD-10-CM | POA: Diagnosis not present

## 2022-07-29 DIAGNOSIS — Z5111 Encounter for antineoplastic chemotherapy: Secondary | ICD-10-CM | POA: Diagnosis not present

## 2022-08-03 DIAGNOSIS — L570 Actinic keratosis: Secondary | ICD-10-CM | POA: Diagnosis not present

## 2022-08-03 DIAGNOSIS — Z85828 Personal history of other malignant neoplasm of skin: Secondary | ICD-10-CM | POA: Diagnosis not present

## 2022-08-03 DIAGNOSIS — L821 Other seborrheic keratosis: Secondary | ICD-10-CM | POA: Diagnosis not present

## 2022-08-03 DIAGNOSIS — L820 Inflamed seborrheic keratosis: Secondary | ICD-10-CM | POA: Diagnosis not present

## 2022-08-05 DIAGNOSIS — Z5111 Encounter for antineoplastic chemotherapy: Secondary | ICD-10-CM | POA: Diagnosis not present

## 2022-08-05 DIAGNOSIS — C670 Malignant neoplasm of trigone of bladder: Secondary | ICD-10-CM | POA: Diagnosis not present

## 2022-08-09 ENCOUNTER — Ambulatory Visit: Payer: Self-pay

## 2022-08-09 NOTE — Patient Outreach (Signed)
  Care Coordination   08/09/2022 Name: Evan Mitchell MRN: [id number] DOB: 1945/10/16   Care Coordination Outreach Attempts:  An unsuccessful telephone outreach was attempted today to offer the patient information about available care coordination services as a benefit of their health plan.   Follow Up Plan:  Additional outreach attempts will be made to offer the patient care coordination information and services.   Encounter Outcome:  No Answer  Care Coordination Interventions Activated:  No   Care Coordination Interventions:  No, not indicated    Daneen Schick, BSW, CDP Social Worker, Certified Dementia Practitioner [facility] Care Management  Care Coordination [phone]

## 2022-08-10 DIAGNOSIS — E781 Pure hyperglyceridemia: Secondary | ICD-10-CM | POA: Diagnosis not present

## 2022-08-10 DIAGNOSIS — I10 Essential (primary) hypertension: Secondary | ICD-10-CM | POA: Diagnosis not present

## 2022-08-10 DIAGNOSIS — R7301 Impaired fasting glucose: Secondary | ICD-10-CM | POA: Diagnosis not present

## 2022-08-10 DIAGNOSIS — M109 Gout, unspecified: Secondary | ICD-10-CM | POA: Diagnosis not present

## 2022-08-10 DIAGNOSIS — Z1212 Encounter for screening for malignant neoplasm of rectum: Secondary | ICD-10-CM | POA: Diagnosis not present

## 2022-08-17 DIAGNOSIS — I10 Essential (primary) hypertension: Secondary | ICD-10-CM | POA: Diagnosis not present

## 2022-08-17 DIAGNOSIS — I454 Nonspecific intraventricular block: Secondary | ICD-10-CM | POA: Diagnosis not present

## 2022-08-17 DIAGNOSIS — C61 Malignant neoplasm of prostate: Secondary | ICD-10-CM | POA: Diagnosis not present

## 2022-08-17 DIAGNOSIS — Z0000 Encounter for general adult medical examination without abnormal findings: Secondary | ICD-10-CM | POA: Diagnosis not present

## 2022-08-17 DIAGNOSIS — R82998 Other abnormal findings in urine: Secondary | ICD-10-CM | POA: Diagnosis not present

## 2022-08-25 ENCOUNTER — Other Ambulatory Visit (HOSPITAL_BASED_OUTPATIENT_CLINIC_OR_DEPARTMENT_OTHER): Payer: Self-pay

## 2022-08-25 MED ORDER — COMIRNATY 30 MCG/0.3ML IM SUSY
PREFILLED_SYRINGE | INTRAMUSCULAR | 0 refills | Status: DC
  Filled 2022-08-25: qty 0.3, 1d supply, fill #0

## 2022-12-05 DIAGNOSIS — J309 Allergic rhinitis, unspecified: Secondary | ICD-10-CM | POA: Diagnosis not present

## 2022-12-05 DIAGNOSIS — Z1152 Encounter for screening for COVID-19: Secondary | ICD-10-CM | POA: Diagnosis not present

## 2022-12-05 DIAGNOSIS — R5383 Other fatigue: Secondary | ICD-10-CM | POA: Diagnosis not present

## 2022-12-05 DIAGNOSIS — R0981 Nasal congestion: Secondary | ICD-10-CM | POA: Diagnosis not present

## 2022-12-05 DIAGNOSIS — I10 Essential (primary) hypertension: Secondary | ICD-10-CM | POA: Diagnosis not present

## 2022-12-05 DIAGNOSIS — R062 Wheezing: Secondary | ICD-10-CM | POA: Diagnosis not present

## 2022-12-05 DIAGNOSIS — J189 Pneumonia, unspecified organism: Secondary | ICD-10-CM | POA: Diagnosis not present

## 2022-12-05 DIAGNOSIS — R051 Acute cough: Secondary | ICD-10-CM | POA: Diagnosis not present

## 2022-12-05 DIAGNOSIS — G43909 Migraine, unspecified, not intractable, without status migrainosus: Secondary | ICD-10-CM | POA: Diagnosis not present

## 2023-01-03 ENCOUNTER — Telehealth: Payer: Self-pay | Admitting: Cardiology

## 2023-01-03 NOTE — Telephone Encounter (Signed)
Spoke with patient wife and she would like for you to give her a call back to help schedule a sooner appointment for patient. He has appointment scheduled for 6/3 but she needs to reschedule.

## 2023-01-03 NOTE — Telephone Encounter (Signed)
Pt spouse would like to speak to [place], RN to get pt a sooner appt.

## 2023-01-03 NOTE — Telephone Encounter (Signed)
Spoke to patient's wife she stated she is upset with how we schedule appointments.Stated when she called to schedule 1 year follow up appointment with Dr.Jordan his schedule was full.Stated she was talked to very rude.Advised I will report this to my nurse manager.Stated her husband is doing good.He does not need any medication refills at this time.Appointment scheduled with Dr.Jordan 02/10/23 at 3:30 pm.

## 2023-01-06 DIAGNOSIS — Z8546 Personal history of malignant neoplasm of prostate: Secondary | ICD-10-CM | POA: Diagnosis not present

## 2023-01-09 DIAGNOSIS — J189 Pneumonia, unspecified organism: Secondary | ICD-10-CM | POA: Diagnosis not present

## 2023-01-11 NOTE — Telephone Encounter (Addendum)
Returned call to patient's wife, Evan Mitchell (ok per DPR). Evan Mitchell reports she has had ongoing issues with scheduling access for the past two years. Two weeks ago, she received a reminder letter to schedule a 1 year follow-up. She states pt already had an appt scheduled for 02/20/23 when letter was received, but she actually needed to reschedule it due to pt having another appt that day. Upon further review during our conversation today, Evan Mitchell states recall letter is dated 09/2022.  She called to reschedule and states she spoke with Chloe. Evan Mitchell states that she was told immediately that Dr. [place] was booked out until October. Evan Mitchell then asked to speak with Elnita Maxwell, Dr. Elvis Coil nurse, and the call was abruptly disconnected. Evan Mitchell expressed that she felt that conversation was unprofessional and inappropriate. Advised I will escalate her concerns to the [facility] manager, Fayrene Fearing, and thanked her for providing feedback. Evan Mitchell verbalized appreciation of follow-up call and denies additional questions or patient needs at this time. Provided direct number if any additional concerns arise.

## 2023-01-16 DIAGNOSIS — Z8551 Personal history of malignant neoplasm of bladder: Secondary | ICD-10-CM | POA: Diagnosis not present

## 2023-01-16 DIAGNOSIS — Z8546 Personal history of malignant neoplasm of prostate: Secondary | ICD-10-CM | POA: Diagnosis not present

## 2023-01-20 ENCOUNTER — Ambulatory Visit: Payer: Medicare Other | Admitting: Cardiology

## 2023-02-03 DIAGNOSIS — C670 Malignant neoplasm of trigone of bladder: Secondary | ICD-10-CM | POA: Diagnosis not present

## 2023-02-03 DIAGNOSIS — Z5111 Encounter for antineoplastic chemotherapy: Secondary | ICD-10-CM | POA: Diagnosis not present

## 2023-02-07 NOTE — Progress Notes (Signed)
Evan Mitchell Date of Birth: 11/22/1945 Medical Record #[id number]  History of Present Illness: Evan Mitchell is seen today for followup of his hypertension and hyperlipidemia. He has a chronic LBBB.   He has a history of  Prostate and bladder CA. Treated with BCG irrigations and RT/hormonal therapy. He just completed his last BCG treatment.   He is feeling well. BP has been in excellent control at home. No chest pain or dyspnea.    Current Outpatient Medications on File Prior to Visit  Medication Sig Dispense Refill   acetaminophen (TYLENOL) 650 MG CR tablet Take 1,300 mg by mouth at bedtime as needed for pain.     allopurinol (ZYLOPRIM) 300 MG tablet Take 300 mg by mouth daily.      Calcium Carbonate-Vitamin D (CALCIUM + D PO) Take 2 tablets by mouth daily with breakfast.      carvedilol (COREG) 25 MG tablet Take 1 tablet (25 mg total) by mouth 2 (two) times daily with a meal. Keep upcoming appointment for future refills. 180 tablet 3   diclofenac Sodium (VOLTAREN) 1 % GEL Apply 2 g topically 4 (four) times daily. 350 g 3   escitalopram (LEXAPRO) 5 MG tablet Take 5 mg by mouth daily.     GLUCOSAMINE-CHONDROITIN PO Take 1 tablet by mouth 3 (three) times daily with meals.      hydrochlorothiazide (HYDRODIURIL) 25 MG tablet TAKE 1 TABLET (25 MG TOTAL) BY MOUTH DAILY. (Patient taking differently: Take 25 mg by mouth daily.) 90 tablet 2   lisinopril (PRINIVIL,ZESTRIL) 40 MG tablet Take 40 mg by mouth daily.     Multiple Vitamins-Minerals (PRESERVISION AREDS PO) Take 1 tablet by mouth 2 (two) times daily. Breakfast & supper     NON FORMULARY Elderberry gummies     Omega-3 Fatty Acids (FISH OIL PO) Take 1 tablet by mouth 3 (three) times daily with meals.      simvastatin (ZOCOR) 40 MG tablet Take 40 mg by mouth daily with supper.      vitamin C (ASCORBIC ACID) 500 MG tablet Take 500 mg by mouth daily with breakfast.      Current Facility-Administered Medications on File Prior to Visit  Medication  Dose Route Frequency Provider Last Rate Last Admin   gemcitabine (GEMZAR) chemo syringe for bladder instillation 2,000 mg  2,000 mg Bladder Instillation Once Bjorn Pippin, MD        Allergies  Allergen Reactions   Aleve [Naproxen Sodium] Swelling    Fluid retention   Codeine     nausea   Sildenafil     Other reaction(s): Headaches, flushing   Tramadol     Intolerance. Causes lightheadedness, dizziness, and unsteady gait.    Past Medical History:  Diagnosis Date   Allergic rhinitis    Anxiety    Arthritis    hand and feet   History of syncope 10/2017   HTN (hypertension)    Hypertriglyceridemia    Mild   Impaired fasting glucose    Inguinal hernia    Left   LBBB (left bundle branch block) 12/12/2012   PONV (postoperative nausea and vomiting)    nausea,dizziness after anesthesia   Pre-diabetes    control with diet and exercise   Prostate cancer ([facility])    Skin cancer 2018   basal and squamous cell carcinoma removed and chemotherapy cream applied.    Past Surgical History:  Procedure Laterality Date   COLONOSCOPY     HERNIA REPAIR  1995   INGUINAL HERNIA  REPAIR Left 02/28/2019   Procedure: LEFT INGUINAL HERNIA REPAIR WITH MESH;  Surgeon: Abigail Miyamoto, MD;  Location: [facility];  Service: General;  Laterality: Left;   TONSILLECTOMY  1947   TRANSURETHRAL RESECTION OF BLADDER TUMOR N/A 10/29/2019   Procedure: TRANSURETHRAL RESECTION OF BLADDER TUMOR (TURBT)  INSTILL GEMCITABINE;  Surgeon: Bjorn Pippin, MD;  [facility];  Service: Urology;  Laterality: N/A;    Social History   Tobacco Use  Smoking Status Former   Packs/day: 2.00   Years: 20.00   Additional pack years: 0.00   Total pack years: 40.00   Types: Cigarettes   Quit date: 09/19/1976   Years since quitting: 46.4  Smokeless Tobacco Never    Social History   Substance and Sexual Activity  Alcohol Use No   Alcohol/week: 0.0 standard drinks of alcohol    Family History  Problem  Relation Age of Onset   Heart attack Mother    Skin cancer Mother    Diabetes Mother    Hypertension Mother    Melanoma Mother    Cancer Maternal Grandfather        throat/lung cancer   Melanoma Sister    Melanoma Sister    Prostate cancer Brother 70   Colon cancer Neg Hx    Breast cancer Neg Hx    Pancreatic cancer Neg Hx     Review of Systems: As noted in history of present illness.  All other systems were reviewed and are negative.  Physical Exam: BP (!) 158/80   Pulse 69   Ht 6' (1.829 m)   Wt 217 lb (98.4 kg)   SpO2 97%   BMI 29.43 kg/m  GENERAL:  Well appearing HEENT:  PERRL, EOMI, sclera are clear. Oropharynx is clear. NECK:  No jugular venous distention, carotid upstroke brisk and symmetric, no bruits, no thyromegaly or adenopathy LUNGS:  Clear to auscultation bilaterally CHEST:  Unremarkable HEART:  RRR,  PMI not displaced or sustained,S1 and S2 within normal limits, no S3, no S4: no clicks, no rubs, no murmurs ABD:  Soft, nontender. BS +, no masses or bruits. No hepatomegaly, no splenomegaly EXT:  2 + pulses throughout, no edema, no cyanosis no clubbing SKIN:  Warm and dry.  No rashes NEURO:  Alert and oriented x 3. Cranial nerves II through XII intact. PSYCH:  Cognitively intact   LABORATORY DATA:  Lab Results  Component Value Date   WBC 5.6 10/28/2019   HGB 16.4 10/28/2019   HCT 51.1 10/28/2019   PLT 130 (L) 10/28/2019   GLUCOSE 120 (H) 10/28/2019   ALT 46 (H) 11/30/2018   AST 38 11/30/2018   NA 141 10/28/2019   K 3.8 10/28/2019   CL 105 10/28/2019   CREATININE 0.86 10/28/2019   BUN 16 10/28/2019   CO2 28 10/28/2019   HGBA1C 6.1 (H) 10/28/2019   Labs reviewed from primary care: 02/09/17: cholesterol 121, triglycerides 106, HDL 42, LDL 58. Glucose 113. Other chemistries, CBC, TSH normal. PSA 7.44. A1c 5.7% Dated 03/29/19: cholesterol 131, triglycerides 90, HDL 43, LDL 70. A1c 5.7%. Normal CMET, CBC, TSH Dated 07/28/21: cholesterol 125,  triglycerides 101, HDL 46, LDL 59. A1c 5.8%. CMET normal. Dated 08/10/22: cholesterol 120, triglycerides 119, HDL 41, LDL 55. A1c 5.9%. CMET and TSH normal.  Ecg today shows NSR with rate 69, nonspecific ST abnormality.  I have personally reviewed and interpreted this study.  Assessment / Plan: 1. Hypertension. Blood pressure is  well controlled on combination of carvedilol,  lisinopril, and HCTZ.   2. Left bundle branch block- intermittent. No ischemia noted on prior stress testing. Ejection fraction was normal by prior echo. LBBB is not present today  3. Hypercholesterolemia. On statin with excellent results.   Follow up in one year

## 2023-02-09 DIAGNOSIS — Z5111 Encounter for antineoplastic chemotherapy: Secondary | ICD-10-CM | POA: Diagnosis not present

## 2023-02-09 DIAGNOSIS — C670 Malignant neoplasm of trigone of bladder: Secondary | ICD-10-CM | POA: Diagnosis not present

## 2023-02-10 ENCOUNTER — Encounter: Payer: Self-pay | Admitting: Cardiology

## 2023-02-10 ENCOUNTER — Ambulatory Visit: Payer: Medicare Other | Attending: Cardiology | Admitting: Cardiology

## 2023-02-10 VITALS — BP 158/80 | HR 69 | Ht 72.0 in | Wt 217.0 lb

## 2023-02-10 DIAGNOSIS — E7800 Pure hypercholesterolemia, unspecified: Secondary | ICD-10-CM | POA: Diagnosis not present

## 2023-02-10 DIAGNOSIS — I10 Essential (primary) hypertension: Secondary | ICD-10-CM

## 2023-02-10 DIAGNOSIS — I447 Left bundle-branch block, unspecified: Secondary | ICD-10-CM | POA: Diagnosis not present

## 2023-02-10 MED ORDER — HYDROCHLOROTHIAZIDE 25 MG PO TABS: ORAL_TABLET | ORAL | 3 refills | Status: DC

## 2023-02-10 MED ORDER — CARVEDILOL 25 MG PO TABS: 25.0000 mg | ORAL_TABLET | Freq: Two times a day (BID) | ORAL | 3 refills | Status: DC

## 2023-02-10 NOTE — Patient Instructions (Signed)
Medication Instructions:  Continue same medications *If you need a refill on your cardiac medications before your next appointment, please call your pharmacy*   Lab Work: None ordered   Testing/Procedures: None ordered   Follow-Up: At [facility], you and your health needs are our priority.  As part of our continuing mission to provide you with exceptional heart care, we have created designated Provider Care Teams.  These Care Teams include your primary Cardiologist (physician) and Advanced Practice Providers (APPs -  Physician Assistants and Nurse Practitioners) who all work together to provide you with the care you need, when you need it.  We recommend signing up for the patient portal called "MyChart".  Sign up information is provided on this After Visit Summary.  MyChart is used to connect with patients for Virtual Visits (Telemedicine).  Patients are able to view lab/test results, encounter notes, upcoming appointments, etc.  Non-urgent messages can be sent to your provider as well.   To learn more about what you can do with MyChart, go to [email or web address].    Your next appointment:  1 year   Call in Feb to schedule May appointment     Provider:  Dr.Jordan   

## 2023-02-17 DIAGNOSIS — Z5111 Encounter for antineoplastic chemotherapy: Secondary | ICD-10-CM | POA: Diagnosis not present

## 2023-02-17 DIAGNOSIS — C670 Malignant neoplasm of trigone of bladder: Secondary | ICD-10-CM | POA: Diagnosis not present

## 2023-02-20 ENCOUNTER — Ambulatory Visit: Payer: Medicare Other | Admitting: Cardiology

## 2023-02-20 DIAGNOSIS — R7301 Impaired fasting glucose: Secondary | ICD-10-CM | POA: Diagnosis not present

## 2023-02-20 DIAGNOSIS — I10 Essential (primary) hypertension: Secondary | ICD-10-CM | POA: Diagnosis not present

## 2023-02-21 ENCOUNTER — Telehealth: Payer: Self-pay | Admitting: *Deleted

## 2023-02-21 NOTE — Telephone Encounter (Signed)
Pt's wife, Aggie Cosier ([place]), left message on Clinical Supervisor VM requesting call back about a billing/insurance issue.   Returned call to [place]. She was concerned that updated insurance card may not be on file for recent visit as there was some miscommunication at check-in. Confirmed with Erskine Squibb, Patient Account Specialist, and Orpah Cobb, Geologist, engineering, that current insurance information is on file. Temple Pacini for providing feedback. She verbalized appreciation of call back and denied additional concerns at this time.

## 2023-02-27 DIAGNOSIS — K080 Exfoliation of teeth due to systemic causes: Secondary | ICD-10-CM | POA: Diagnosis not present

## 2023-03-15 DIAGNOSIS — K080 Exfoliation of teeth due to systemic causes: Secondary | ICD-10-CM | POA: Diagnosis not present

## 2023-03-17 ENCOUNTER — Telehealth: Payer: Self-pay | Admitting: Cardiology

## 2023-03-17 NOTE — Telephone Encounter (Signed)
Spoke to patient's wife.She stated she was calling for herself not her husband.She Evan Mitchell Beg's telephone note.

## 2023-03-17 NOTE — Telephone Encounter (Signed)
Pt's wife, Aggie Cosier is requesting a callback from Elnita Maxwell to discuss something confidential regarding the pt. Please advise

## 2023-06-20 DIAGNOSIS — Z961 Presence of intraocular lens: Secondary | ICD-10-CM | POA: Diagnosis not present

## 2023-06-20 DIAGNOSIS — H11041 Peripheral pterygium, stationary, right eye: Secondary | ICD-10-CM | POA: Diagnosis not present

## 2023-06-20 DIAGNOSIS — B394 Histoplasmosis capsulati, unspecified: Secondary | ICD-10-CM | POA: Diagnosis not present

## 2023-06-24 DIAGNOSIS — Z23 Encounter for immunization: Secondary | ICD-10-CM | POA: Diagnosis not present

## 2023-07-14 DIAGNOSIS — C61 Malignant neoplasm of prostate: Secondary | ICD-10-CM | POA: Diagnosis not present

## 2023-07-14 DIAGNOSIS — Z8546 Personal history of malignant neoplasm of prostate: Secondary | ICD-10-CM | POA: Diagnosis not present

## 2023-07-21 DIAGNOSIS — Z8551 Personal history of malignant neoplasm of bladder: Secondary | ICD-10-CM | POA: Diagnosis not present

## 2023-07-21 DIAGNOSIS — N403 Nodular prostate with lower urinary tract symptoms: Secondary | ICD-10-CM | POA: Diagnosis not present

## 2023-07-21 DIAGNOSIS — R351 Nocturia: Secondary | ICD-10-CM | POA: Diagnosis not present

## 2023-07-21 DIAGNOSIS — Z8546 Personal history of malignant neoplasm of prostate: Secondary | ICD-10-CM | POA: Diagnosis not present

## 2023-08-07 DIAGNOSIS — L820 Inflamed seborrheic keratosis: Secondary | ICD-10-CM | POA: Diagnosis not present

## 2023-08-07 DIAGNOSIS — L821 Other seborrheic keratosis: Secondary | ICD-10-CM | POA: Diagnosis not present

## 2023-08-07 DIAGNOSIS — L570 Actinic keratosis: Secondary | ICD-10-CM | POA: Diagnosis not present

## 2023-08-07 DIAGNOSIS — D1801 Hemangioma of skin and subcutaneous tissue: Secondary | ICD-10-CM | POA: Diagnosis not present

## 2023-08-07 DIAGNOSIS — Z85828 Personal history of other malignant neoplasm of skin: Secondary | ICD-10-CM | POA: Diagnosis not present

## 2023-08-07 DIAGNOSIS — L814 Other melanin hyperpigmentation: Secondary | ICD-10-CM | POA: Diagnosis not present

## 2023-08-21 DIAGNOSIS — C61 Malignant neoplasm of prostate: Secondary | ICD-10-CM | POA: Diagnosis not present

## 2023-08-21 DIAGNOSIS — M109 Gout, unspecified: Secondary | ICD-10-CM | POA: Diagnosis not present

## 2023-08-21 DIAGNOSIS — R7301 Impaired fasting glucose: Secondary | ICD-10-CM | POA: Diagnosis not present

## 2023-08-21 DIAGNOSIS — I10 Essential (primary) hypertension: Secondary | ICD-10-CM | POA: Diagnosis not present

## 2023-08-27 DIAGNOSIS — Z1212 Encounter for screening for malignant neoplasm of rectum: Secondary | ICD-10-CM | POA: Diagnosis not present

## 2023-08-28 DIAGNOSIS — I10 Essential (primary) hypertension: Secondary | ICD-10-CM | POA: Diagnosis not present

## 2023-08-28 DIAGNOSIS — Z1339 Encounter for screening examination for other mental health and behavioral disorders: Secondary | ICD-10-CM | POA: Diagnosis not present

## 2023-08-28 DIAGNOSIS — R82998 Other abnormal findings in urine: Secondary | ICD-10-CM | POA: Diagnosis not present

## 2023-08-28 DIAGNOSIS — Z0000 Encounter for general adult medical examination without abnormal findings: Secondary | ICD-10-CM | POA: Diagnosis not present

## 2023-08-28 DIAGNOSIS — Z1331 Encounter for screening for depression: Secondary | ICD-10-CM | POA: Diagnosis not present

## 2023-09-26 DIAGNOSIS — K080 Exfoliation of teeth due to systemic causes: Secondary | ICD-10-CM | POA: Diagnosis not present

## 2023-10-16 DIAGNOSIS — K080 Exfoliation of teeth due to systemic causes: Secondary | ICD-10-CM | POA: Diagnosis not present

## 2024-01-15 DIAGNOSIS — Z8546 Personal history of malignant neoplasm of prostate: Secondary | ICD-10-CM | POA: Diagnosis not present

## 2024-01-22 DIAGNOSIS — E349 Endocrine disorder, unspecified: Secondary | ICD-10-CM | POA: Diagnosis not present

## 2024-01-22 DIAGNOSIS — Z8546 Personal history of malignant neoplasm of prostate: Secondary | ICD-10-CM | POA: Diagnosis not present

## 2024-01-22 DIAGNOSIS — Z8551 Personal history of malignant neoplasm of bladder: Secondary | ICD-10-CM | POA: Diagnosis not present

## 2024-01-25 DIAGNOSIS — I10 Essential (primary) hypertension: Secondary | ICD-10-CM | POA: Diagnosis not present

## 2024-01-25 DIAGNOSIS — C61 Malignant neoplasm of prostate: Secondary | ICD-10-CM | POA: Diagnosis not present

## 2024-02-01 NOTE — Progress Notes (Signed)
 Evan Mitchell Date of Birth: 1946/05/04 Medical Record #[id number]  History of Present Illness: Evan Mitchell is seen today for followup of his hypertension and hyperlipidemia. He has a history of intermittent LBBB.   He has a history of  Prostate and bladder CA. Treated with BCG irrigations and RT/hormonal therapy. He has completed treatment.   He is feeling well. BP has been in excellent control at home. No chest pain or dyspnea.    Current Outpatient Medications on File Prior to Visit  Medication Sig Dispense Refill   acetaminophen  (TYLENOL ) 650 MG CR tablet Take 1,300 mg by mouth at bedtime as needed for pain.     allopurinol (ZYLOPRIM) 300 MG tablet Take 300 mg by mouth daily.      Calcium  Carbonate-Vitamin D (CALCIUM  + D PO) Take 2 tablets by mouth daily with breakfast.      carvedilol  (COREG ) 25 MG tablet Take 1 tablet (25 mg total) by mouth 2 (two) times daily with a meal. Keep upcoming appointment for future refills. 180 tablet 3   diclofenac  Sodium (VOLTAREN ) 1 % GEL Apply 2 g topically 4 (four) times daily. 350 g 3   escitalopram (LEXAPRO) 5 MG tablet Take 5 mg by mouth daily.     FLUoxetine (PROZAC) 10 MG tablet Take 10 mg by mouth daily.     GLUCOSAMINE-CHONDROITIN PO Take 1 tablet by mouth 3 (three) times daily with meals.      hydrochlorothiazide  (HYDRODIURIL ) 25 MG tablet TAKE 1 TABLET (25 MG TOTAL) BY MOUTH DAILY. 90 tablet 3   Lactobacillus (PROBIOTIC ACIDOPHILUS PO) Take by mouth daily.     lisinopril (PRINIVIL,ZESTRIL) 40 MG tablet Take 40 mg by mouth daily.     meloxicam (MOBIC) 7.5 MG tablet Take 7.5 mg by mouth daily.     Multiple Vitamins-Minerals (PRESERVISION AREDS PO) Take 1 tablet by mouth 2 (two) times daily. Breakfast & supper     NON FORMULARY Elderberry gummies     Omega-3 Fatty Acids (FISH OIL PO) Take 1 tablet by mouth 3 (three) times daily with meals.      vitamin C (ASCORBIC ACID) 500 MG tablet Take 500 mg by mouth daily with breakfast.      Current  Facility-Administered Medications on File Prior to Visit  Medication Dose Route Frequency Provider Last Rate Last Admin   gemcitabine  (GEMZAR ) chemo syringe for bladder instillation 2,000 mg  2,000 mg Bladder Instillation Once Wrenn, John, MD        Allergies  Allergen Reactions   Aleve [Naproxen Sodium] Swelling    Fluid retention   Codeine     nausea   Sildenafil     Other reaction(s): Headaches, flushing   Tramadol      Intolerance. Causes lightheadedness, dizziness, and unsteady gait.    Past Medical History:  Diagnosis Date   Allergic rhinitis    Anxiety    Arthritis    hand and feet   History of syncope 10/2017   HTN (hypertension)    Hypertriglyceridemia    Mild   Impaired fasting glucose    Inguinal hernia    Left   LBBB (left bundle branch block) 12/12/2012   PONV (postoperative nausea and vomiting)    nausea,dizziness after anesthesia   Pre-diabetes    control with diet and exercise   Prostate cancer ([facility])    Skin cancer 2018   basal and squamous cell carcinoma removed and chemotherapy cream applied.    Past Surgical History:  Procedure Laterality Date  COLONOSCOPY     HERNIA REPAIR  1995   INGUINAL HERNIA REPAIR Left 02/28/2019   Procedure: LEFT INGUINAL HERNIA REPAIR WITH MESH;  Surgeon: Oza Blumenthal, MD;  Location: [facility];  Service: General;  Laterality: Left;   TONSILLECTOMY  1947   TRANSURETHRAL RESECTION OF BLADDER TUMOR N/A 10/29/2019   Procedure: TRANSURETHRAL RESECTION OF BLADDER TUMOR (TURBT)  INSTILL GEMCITABINE ;  Surgeon: Homero Luster, MD;  Location: [facility];  Service: Urology;  Laterality: N/A;    Social History   Tobacco Use  Smoking Status Former   Current packs/day: 0.00   Average packs/day: 2.0 packs/day for 20.0 years (40.0 ttl pk-yrs)   Types: Cigarettes   Start date: 09/19/1956   Quit date: 09/19/1976   Years since quitting: 47.4  Smokeless Tobacco Never    Social History   Substance and Sexual Activity   Alcohol  Use No   Alcohol /week: 0.0 standard drinks of alcohol     Family History  Problem Relation Age of Onset   Heart attack Mother    Skin cancer Mother    Diabetes Mother    Hypertension Mother    Melanoma Mother    Cancer Maternal Grandfather        throat/lung cancer   Melanoma Sister    Melanoma Sister    Prostate cancer Brother 38   Colon cancer Neg Hx    Breast cancer Neg Hx    Pancreatic cancer Neg Hx     Review of Systems: As noted in history of present illness.  All other systems were reviewed and are negative.  Physical Exam: BP 136/72   Pulse 79   Ht 6' (1.829 m)   Wt 220 lb 6.4 oz (100 kg)   SpO2 93%   BMI 29.89 kg/m  GENERAL:  Well appearing HEENT:  PERRL, EOMI, sclera are clear. Oropharynx is clear. NECK:  No jugular venous distention, carotid upstroke brisk and symmetric, no bruits, no thyromegaly or adenopathy LUNGS:  Clear to auscultation bilaterally CHEST:  Unremarkable HEART:  RRR,  PMI not displaced or sustained,S1 and S2 within normal limits, no S3, no S4: no clicks, no rubs, no murmurs ABD:  Soft, nontender. BS +, no masses or bruits. No hepatomegaly, no splenomegaly EXT:  2 + pulses throughout, no edema, no cyanosis no clubbing SKIN:  Warm and dry.  No rashes NEURO:  Alert and oriented x 3. Cranial nerves II through XII intact. PSYCH:  Cognitively intact   LABORATORY DATA:  Lab Results  Component Value Date   WBC 5.6 10/28/2019   HGB 16.4 10/28/2019   HCT 51.1 10/28/2019   PLT 130 (L) 10/28/2019   GLUCOSE 120 (H) 10/28/2019   ALT 46 (H) 11/30/2018   AST 38 11/30/2018   NA 141 10/28/2019   K 3.8 10/28/2019   CL 105 10/28/2019   CREATININE 0.86 10/28/2019   BUN 16 10/28/2019   CO2 28 10/28/2019   HGBA1C 6.1 (H) 10/28/2019   Labs reviewed from primary care: 02/09/17: cholesterol 121, triglycerides 106, HDL 42, LDL 58. Glucose 113. Other chemistries, CBC, TSH normal. PSA 7.44. A1c 5.7% Dated 03/29/19: cholesterol 131, triglycerides  90, HDL 43, LDL 70. A1c 5.7%. Normal CMET, CBC, TSH Dated 07/28/21: cholesterol 125, triglycerides 101, HDL 46, LDL 59. A1c 5.8%. CMET normal. Dated 08/10/22: cholesterol 120, triglycerides 119, HDL 41, LDL 55. A1c 5.9%. CMET and TSH normal. Dated 08/21/23: cholesterol 132, triglycerides 117, HDL 45, LDL 64. A1c 5.7%. CMET and TSH normal.  EKG Interpretation Date/Time:  Monday Feb 05 2024 15:16:54 EDT Ventricular Rate:  79 PR Interval:  170 QRS Duration:  150 QT Interval:  444 QTC Calculation: 509 R Axis:   15  Text Interpretation: Sinus rhythm with frequent Premature ventricular complexes Left bundle branch block When compared with ECG of May 25,2024 Premature ventricular complexes are now Present Left bundle branch block is now Present Borderline criteria for Anterior infarct are no longer Present Confirmed by [place], Fernie Grimm [phone]) on 02/05/2024 3:25:29 PM    Assessment / Plan: 1. Hypertension. Blood pressure is  well controlled on combination of carvedilol , lisinopril, and HCTZ.   2. Left bundle branch block- intermittent. No ischemia noted on prior stress testing. Ejection fraction was normal by prior echo. LBBB is not present today  3. Hypercholesterolemia. On modest dose statin with LDL 64. Goal LDL < 55. He has significant coronary and aortic calcification noted on prior CTs. Will switch simvastatin to Crestor  40 mg daily. Repeat lab in 3 months.   Follow up in one year

## 2024-02-05 ENCOUNTER — Ambulatory Visit: Payer: Medicare Other | Attending: Cardiology | Admitting: Cardiology

## 2024-02-05 ENCOUNTER — Encounter: Payer: Self-pay | Admitting: Cardiology

## 2024-02-05 VITALS — BP 136/72 | HR 79 | Ht 72.0 in | Wt 220.4 lb

## 2024-02-05 DIAGNOSIS — E7800 Pure hypercholesterolemia, unspecified: Secondary | ICD-10-CM

## 2024-02-05 DIAGNOSIS — I10 Essential (primary) hypertension: Secondary | ICD-10-CM | POA: Diagnosis not present

## 2024-02-05 MED ORDER — ROSUVASTATIN CALCIUM 40 MG PO TABS: 40.0000 mg | ORAL_TABLET | Freq: Every day | ORAL | 3 refills | Status: AC

## 2024-02-05 NOTE — Patient Instructions (Signed)
 Medication Instructions:  Stop Simvastatin Start Rosuvastatin  40 mg daily Continue all other medications *If you need a refill on your cardiac medications before your next appointment, please call your pharmacy*  Lab Work: Lipid and hepatic panels in 3 months  Mid August  Testing/Procedures: None ordered  Follow-Up: At [facility], you and your health needs are our priority.  As part of our continuing mission to provide you with exceptional heart care, our providers are all part of one team.  This team includes your primary Cardiologist (physician) and Advanced Practice Providers or APPs (Physician Assistants and Nurse Practitioners) who all work together to provide you with the care you need, when you need it.  Your next appointment:  1 year   Call in Feb to schedule May appointment     Provider:  Dr.Jordan    We recommend signing up for the patient portal called "MyChart".  Sign up information is provided on this After Visit Summary.  MyChart is used to connect with patients for Virtual Visits (Telemedicine).  Patients are able to view lab/test results, encounter notes, upcoming appointments, etc.  Non-urgent messages can be sent to your provider as well.   To learn more about what you can do with MyChart, go to [email or web address].

## 2024-02-08 ENCOUNTER — Ambulatory Visit (INDEPENDENT_AMBULATORY_CARE_PROVIDER_SITE_OTHER): Admitting: Physician Assistant

## 2024-02-08 ENCOUNTER — Other Ambulatory Visit (INDEPENDENT_AMBULATORY_CARE_PROVIDER_SITE_OTHER): Payer: Self-pay

## 2024-02-08 ENCOUNTER — Encounter: Payer: Self-pay | Admitting: Physician Assistant

## 2024-02-08 DIAGNOSIS — G8929 Other chronic pain: Secondary | ICD-10-CM

## 2024-02-08 DIAGNOSIS — M1711 Unilateral primary osteoarthritis, right knee: Secondary | ICD-10-CM | POA: Diagnosis not present

## 2024-02-08 DIAGNOSIS — M25561 Pain in right knee: Secondary | ICD-10-CM

## 2024-02-08 MED ORDER — LIDOCAINE HCL 1 % IJ SOLN
4.0000 mL | INTRAMUSCULAR | Status: AC | PRN
  Administered 2024-02-08: 4 mL

## 2024-02-08 MED ORDER — METHYLPREDNISOLONE ACETATE 40 MG/ML IJ SUSP
40.0000 mg | INTRAMUSCULAR | Status: AC | PRN
  Administered 2024-02-08: 40 mg via INTRA_ARTICULAR

## 2024-02-08 NOTE — Progress Notes (Signed)
 Office Visit Note   Patient: Evan Mitchell           Date of Birth: Oct 28, 1945           MRN: [id number] Visit Date: 02/08/2024              Requested by: Lonzie Robins, MD [address] [place],  [place] [address] PCP: Avva, [name], MD   Assessment & Plan: Visit Diagnoses:  1. Chronic pain of right knee     Plan:  Will have him work on range of motion quad strengthening.  Discussed knee friendly exercises with him.  He will follow-up with Dr. Lucienne Ryder in 2 weeks to see what type of response he had to the injection.  Questions were encouraged and answered at length.  Follow-Up Instructions: Return in about 2 weeks (around 02/22/2024).   Orders:  Orders Placed This Encounter  Procedures   Large Joint Inj   XR Knee 1-2 Views Right   No orders of the defined types were placed in this encounter.     Procedures: Large Joint Inj on 02/08/2024 5:52 PM Indications: pain Details: 22 G 1.5 in needle, anterolateral approach  Arthrogram: No  Medications: 4 mL lidocaine  1 %; 40 mg methylPREDNISolone  acetate 40 MG/ML Aspirate: 10 mL yellow Outcome: tolerated well, no immediate complications Procedure, treatment alternatives, risks and benefits explained, specific risks discussed. Consent was given by the patient. Immediately prior to procedure a time out was called to verify the correct patient, procedure, equipment, support staff and site/side marked as required. Patient was prepped and draped in the usual sterile fashion.       Clinical Data: No additional findings.   Subjective: Chief Complaint  Patient presents with   Right Knee - Pain    HPI Evan Mitchell 78 year old male who is seen as as a patient for the first time however he has been seen by Dr. Roxann Copper in the office before and his wife is a patient of Dr. Arvella Bird.  Comes in today with increasing right knee pain.  He states that for 5 years when getting up out of a chair he has had some knee discomfort  when first getting started.  However 3 weeks ago he stepped in a hole and twisted his knee since then he has had increased pain in the knee.  States the knee does give way.  Most of the pain is lateral aspect of the knee.  He has tried meloxicam and Tylenol .  Despite this he continues to have pain in the knee.  Prediabetic    Review of Systems Negative for fevers or chills.  Objective: Vital Signs: There were no vitals taken for this visit.  Physical Exam Constitutional:      Appearance: He is not ill-appearing or diaphoretic.  Pulmonary:     Effort: Pulmonary effort is normal.  Neurological:     Mental Status: He is alert and oriented to person, place, and time.  Psychiatric:        Mood and Affect: Mood normal.        Behavior: Behavior normal.     Ortho Exam Bilateral knees: Good range of motion of both knees.  Right patellofemoral crepitus with range of motion.  Valgus deformity of the right knee.  No instability valgus varus stressing of either knee.  Positive effusion right knee.  No abnormal warmth or erythema of either knee. Specialty Comments:  No specialty comments available.  Imaging: XR Knee 1-2 Views Right Result Date: 02/08/2024  Right knee 2 views: Knee is well located.  Moderate least severe narrowing lateral compartment.  Patellofemoral arthritis moderate with superior osteophyte.  Medial compartment overall well-maintained.  No acute fractures.  No bony lesions or bony abnormalities otherwise.    PMFS History: Patient Active Problem List   Diagnosis Date Noted   Malignant neoplasm of prostate (HCC) 11/12/2019   LBBB (left bundle branch block) 12/12/2012   Hypertension, uncontrolled 01/10/2012   Pre-diabetes 01/10/2012   Hyperlipidemia 01/10/2012   Past Medical History:  Diagnosis Date   Allergic rhinitis    Anxiety    Arthritis    hand and feet   History of syncope 10/2017   HTN (hypertension)    Hypertriglyceridemia    Mild   Impaired fasting  glucose    Inguinal hernia    Left   LBBB (left bundle branch block) 12/12/2012   PONV (postoperative nausea and vomiting)    nausea,dizziness after anesthesia   Pre-diabetes    control with diet and exercise   Prostate cancer (HCC)    Skin cancer 2018   basal and squamous cell carcinoma removed and chemotherapy cream applied.    Family History  Problem Relation Age of Onset   Heart attack Mother    Skin cancer Mother    Diabetes Mother    Hypertension Mother    Melanoma Mother    Cancer Maternal Grandfather        throat/lung cancer   Melanoma Sister    Melanoma Sister    Prostate cancer Brother 57   Colon cancer Neg Hx    Breast cancer Neg Hx    Pancreatic cancer Neg Hx     Past Surgical History:  Procedure Laterality Date   COLONOSCOPY     HERNIA REPAIR  1995   INGUINAL HERNIA REPAIR Left 02/28/2019   Procedure: LEFT INGUINAL HERNIA REPAIR WITH MESH;  Surgeon: Oza Blumenthal, MD;  Location: [facility];  Service: General;  Laterality: Left;   TONSILLECTOMY  1947   TRANSURETHRAL RESECTION OF BLADDER TUMOR N/A 10/29/2019   Procedure: TRANSURETHRAL RESECTION OF BLADDER TUMOR (TURBT)  INSTILL GEMCITABINE ;  Surgeon: Homero Luster, MD;  Location: [facility];  Service: Urology;  Laterality: N/A;   Social History   Occupational History   Occupation: custodian    Comment: retired-part time building maintenance  Tobacco Use   Smoking status: Former    Current packs/day: 0.00    Average packs/day: 2.0 packs/day for 20.0 years (40.0 ttl pk-yrs)    Types: Cigarettes    Start date: 09/19/1956    Quit date: 09/19/1976    Years since quitting: 47.4   Smokeless tobacco: Never  Vaping Use   Vaping status: Never Used  Substance and Sexual Activity   Alcohol  use: No    Alcohol /week: 0.0 standard drinks of alcohol    Drug use: No   Sexual activity: Not Currently

## 2024-02-23 DIAGNOSIS — E349 Endocrine disorder, unspecified: Secondary | ICD-10-CM | POA: Diagnosis not present

## 2024-02-26 DIAGNOSIS — R7301 Impaired fasting glucose: Secondary | ICD-10-CM | POA: Diagnosis not present

## 2024-02-26 DIAGNOSIS — I10 Essential (primary) hypertension: Secondary | ICD-10-CM | POA: Diagnosis not present

## 2024-02-26 DIAGNOSIS — M1990 Unspecified osteoarthritis, unspecified site: Secondary | ICD-10-CM | POA: Diagnosis not present

## 2024-03-11 ENCOUNTER — Other Ambulatory Visit: Payer: Self-pay | Admitting: Cardiology

## 2024-03-11 ENCOUNTER — Ambulatory Visit: Admitting: Orthopaedic Surgery

## 2024-03-11 DIAGNOSIS — G8929 Other chronic pain: Secondary | ICD-10-CM | POA: Diagnosis not present

## 2024-03-11 DIAGNOSIS — M25561 Pain in right knee: Secondary | ICD-10-CM

## 2024-03-11 NOTE — Progress Notes (Signed)
 The patient is an 78 year old who comes in 2 weeks after Tory did place an injection in his right knee secondary to acute on chronic pain in that right knee.  The patient has had a twisting injury to the knee.  He has been wearing a knee sleeve.  He said the injection was helpful and his knee is doing great.  He does have known lateral compartment and patellofemoral arthritis with slight valgus malalignment of the right knee.  On exam today there is no effusion.  He does have valgus malalignment but with excellent range of motion of his knee and no significant difficulties.  He was shown quad strengthening exercises which he is compliant with.  This was shown at his last visit.  We did go over again the x-rays from last visit of his right knee showing the issues with arthritis of the lateral compartment and patellofemoral joint.  Since he is doing well I am recommending just stopping the knee sleeve and continued quad strengthening exercises.  We can always repeat a steroid injection down the road if needed.  I do not think hyaluronic acid would be warranted given the amount of arthritis he has.

## 2024-05-08 ENCOUNTER — Other Ambulatory Visit: Payer: Self-pay | Admitting: Cardiology

## 2024-05-10 NOTE — Telephone Encounter (Signed)
Hydrochlorothiazide refill sent to pharmacy 

## 2024-06-18 ENCOUNTER — Other Ambulatory Visit (HOSPITAL_BASED_OUTPATIENT_CLINIC_OR_DEPARTMENT_OTHER): Payer: Self-pay

## 2024-06-18 MED ORDER — FLUZONE HIGH-DOSE 0.5 ML IM SUSY
0.5000 mL | PREFILLED_SYRINGE | Freq: Once | INTRAMUSCULAR | 0 refills | Status: AC
  Filled 2024-06-18: qty 0.5, 1d supply, fill #0

## 2024-06-20 DIAGNOSIS — E7800 Pure hypercholesterolemia, unspecified: Secondary | ICD-10-CM | POA: Diagnosis not present

## 2024-06-21 LAB — HEPATIC FUNCTION PANEL
ALT: 40 IU/L (ref 0–44)
AST: 29 IU/L (ref 0–40)
Albumin: 4.4 g/dL (ref 3.8–4.8)
Alkaline Phosphatase: 44 IU/L — ABNORMAL LOW (ref 47–123)
Bilirubin Total: 0.6 mg/dL (ref 0.0–1.2)
Bilirubin, Direct: 0.23 mg/dL (ref 0.00–0.40)
Total Protein: 6.5 g/dL (ref 6.0–8.5)

## 2024-06-21 LAB — LIPID PANEL
Chol/HDL Ratio: 2.7 ratio (ref 0.0–5.0)
Cholesterol, Total: 107 mg/dL (ref 100–199)
HDL: 39 mg/dL — ABNORMAL LOW (ref >39)
LDL Chol Calc (NIH): 44 mg/dL (ref 0–99)
Triglycerides: 134 mg/dL (ref 0–149)
VLDL Cholesterol Cal: 24 mg/dL (ref 5–40)

## 2024-06-23 ENCOUNTER — Ambulatory Visit: Payer: Self-pay | Admitting: Cardiology

## 2024-06-24 DIAGNOSIS — B394 Histoplasmosis capsulati, unspecified: Secondary | ICD-10-CM | POA: Diagnosis not present

## 2024-06-24 DIAGNOSIS — H11041 Peripheral pterygium, stationary, right eye: Secondary | ICD-10-CM | POA: Diagnosis not present

## 2024-06-24 DIAGNOSIS — Z961 Presence of intraocular lens: Secondary | ICD-10-CM | POA: Diagnosis not present

## 2024-07-01 ENCOUNTER — Ambulatory Visit (INDEPENDENT_AMBULATORY_CARE_PROVIDER_SITE_OTHER): Admitting: Physician Assistant

## 2024-07-01 DIAGNOSIS — M1711 Unilateral primary osteoarthritis, right knee: Secondary | ICD-10-CM

## 2024-07-01 MED ORDER — LIDOCAINE HCL 1 % IJ SOLN
4.0000 mL | INTRAMUSCULAR | Status: AC | PRN
  Administered 2024-07-01: 4 mL

## 2024-07-01 MED ORDER — METHYLPREDNISOLONE ACETATE 40 MG/ML IJ SUSP
40.0000 mg | INTRAMUSCULAR | Status: AC | PRN
  Administered 2024-07-01: 40 mg via INTRA_ARTICULAR

## 2024-07-01 NOTE — Progress Notes (Signed)
 HPI: Evan Mitchell comes in today due to right knee pain.  He was seen last on 02/08/2023 and was given  an injection in his right knee which helped.  He has had no change in his overall medical status.  Denies any fevers chills or injuries to the right knee.  He is asking for repeat injection right knee.  Continues to use Voltaren  gel on the knee which is helpful and on bad days he uses Mobic.  He has known arthritic changes involving the left knee lateral compartment and patellofemoral joint.  Review of systems: See HPI otherwise negative  Physical exam: General Well-developed well-nourished pleasant male in no acute distress Bilateral knees: Good range of motion both knees.  Right knee valgus malalignment.  Positive effusion right knee only.  Impression: Right knee arthritis  Plan: Given the effusion of his knee recommended aspiration and injection of cortisone he was agreeable.  He will follow-up with us  as needed knows to wait least 3 months between injections.  Procedure Note  Patient: Evan Mitchell             Date of Birth: [birth year]           MRN: [id number]             Visit Date: 07/01/2024  Procedures: Visit Diagnoses:  1. Primary osteoarthritis of right knee     Large Joint Inj: R knee on 07/01/2024 6:01 PM Indications: pain Details: 22 G 1.5 in needle, superolateral approach  Arthrogram: No  Medications: 4 mL lidocaine  1 %; 40 mg methylPREDNISolone  acetate 40 MG/ML Aspirate: 11 mL yellow Outcome: tolerated well, no immediate complications Procedure, treatment alternatives, risks and benefits explained, specific risks discussed. Consent was given by the patient. Immediately prior to procedure a time out was called to verify the correct patient, procedure, equipment, support staff and site/side marked as required. Patient was prepped and draped in the usual sterile fashion.

## 2024-07-15 DIAGNOSIS — Z8546 Personal history of malignant neoplasm of prostate: Secondary | ICD-10-CM | POA: Diagnosis not present

## 2024-07-22 ENCOUNTER — Encounter: Payer: Self-pay | Admitting: Radiology

## 2024-07-22 DIAGNOSIS — R6882 Decreased libido: Secondary | ICD-10-CM | POA: Diagnosis not present

## 2024-07-22 DIAGNOSIS — E291 Testicular hypofunction: Secondary | ICD-10-CM | POA: Diagnosis not present

## 2024-08-07 DIAGNOSIS — C4442 Squamous cell carcinoma of skin of scalp and neck: Secondary | ICD-10-CM | POA: Diagnosis not present

## 2024-08-07 DIAGNOSIS — L821 Other seborrheic keratosis: Secondary | ICD-10-CM | POA: Diagnosis not present

## 2024-08-07 DIAGNOSIS — L570 Actinic keratosis: Secondary | ICD-10-CM | POA: Diagnosis not present

## 2024-08-07 DIAGNOSIS — L820 Inflamed seborrheic keratosis: Secondary | ICD-10-CM | POA: Diagnosis not present

## 2024-08-07 DIAGNOSIS — Z85828 Personal history of other malignant neoplasm of skin: Secondary | ICD-10-CM | POA: Diagnosis not present

## 2024-08-07 DIAGNOSIS — D225 Melanocytic nevi of trunk: Secondary | ICD-10-CM | POA: Diagnosis not present

## 2024-08-20 DIAGNOSIS — C4442 Squamous cell carcinoma of skin of scalp and neck: Secondary | ICD-10-CM | POA: Diagnosis not present

## 2024-09-04 DIAGNOSIS — I10 Essential (primary) hypertension: Secondary | ICD-10-CM | POA: Diagnosis not present

## 2024-09-04 DIAGNOSIS — R82998 Other abnormal findings in urine: Secondary | ICD-10-CM | POA: Diagnosis not present

## 2024-09-06 ENCOUNTER — Telehealth: Payer: Self-pay | Admitting: Cardiology

## 2024-09-06 MED ORDER — CARVEDILOL 25 MG PO TABS: 25.0000 mg | ORAL_TABLET | Freq: Two times a day (BID) | ORAL | 0 refills | Status: AC

## 2024-09-06 NOTE — Telephone Encounter (Signed)
 Pts wife requesting a c/b in regards to using a new pharmacy

## 2024-09-06 NOTE — Telephone Encounter (Signed)
 Pt wife request to send all refills to [facility]. Pt Coreg  sent over.
